# Patient Record
Sex: Female | Born: 1975 | Race: Black or African American | Hispanic: No | Marital: Single | Smoking: Never smoker
Health system: Southern US, Community
[De-identification: ages and names within clinical notes are randomized; demographics above are authoritative.]

## PROBLEM LIST (undated history)

## (undated) DIAGNOSIS — F419 Anxiety disorder, unspecified: Secondary | ICD-10-CM

## (undated) DIAGNOSIS — F32A Depression, unspecified: Secondary | ICD-10-CM

## (undated) DIAGNOSIS — T7840XA Allergy, unspecified, initial encounter: Secondary | ICD-10-CM

## (undated) HISTORY — DX: Depression, unspecified: F32.A

## (undated) HISTORY — DX: Allergy, unspecified, initial encounter: T78.40XA

## (undated) HISTORY — DX: Anxiety disorder, unspecified: F41.9

---

## 1999-06-18 ENCOUNTER — Other Ambulatory Visit: Admission: RE | Admit: 1999-06-18 | Discharge: 1999-06-18 | Payer: Self-pay | Admitting: *Deleted

## 2000-07-21 ENCOUNTER — Other Ambulatory Visit: Admission: RE | Admit: 2000-07-21 | Discharge: 2000-07-21 | Payer: Self-pay | Admitting: *Deleted

## 2002-10-04 ENCOUNTER — Other Ambulatory Visit: Admission: RE | Admit: 2002-10-04 | Discharge: 2002-10-04 | Payer: Self-pay | Admitting: *Deleted

## 2003-10-05 ENCOUNTER — Other Ambulatory Visit: Admission: RE | Admit: 2003-10-05 | Discharge: 2003-10-05 | Payer: Self-pay | Admitting: *Deleted

## 2003-12-05 ENCOUNTER — Encounter: Admission: RE | Admit: 2003-12-05 | Discharge: 2003-12-05 | Payer: Self-pay | Admitting: Family Medicine

## 2004-11-06 ENCOUNTER — Other Ambulatory Visit: Admission: RE | Admit: 2004-11-06 | Discharge: 2004-11-06 | Payer: Self-pay | Admitting: *Deleted

## 2004-12-27 ENCOUNTER — Emergency Department (HOSPITAL_COMMUNITY): Admission: EM | Admit: 2004-12-27 | Discharge: 2004-12-27 | Payer: Self-pay | Admitting: Emergency Medicine

## 2005-11-26 ENCOUNTER — Other Ambulatory Visit: Admission: RE | Admit: 2005-11-26 | Discharge: 2005-11-26 | Payer: Self-pay | Admitting: *Deleted

## 2006-12-01 ENCOUNTER — Other Ambulatory Visit: Admission: RE | Admit: 2006-12-01 | Discharge: 2006-12-01 | Payer: Self-pay | Admitting: Family Medicine

## 2007-12-02 ENCOUNTER — Other Ambulatory Visit: Admission: RE | Admit: 2007-12-02 | Discharge: 2007-12-02 | Payer: Self-pay | Admitting: Family Medicine

## 2008-12-05 ENCOUNTER — Other Ambulatory Visit: Admission: RE | Admit: 2008-12-05 | Discharge: 2008-12-05 | Payer: Self-pay | Admitting: Family Medicine

## 2010-01-08 ENCOUNTER — Other Ambulatory Visit: Admission: RE | Admit: 2010-01-08 | Discharge: 2010-01-08 | Payer: Self-pay | Admitting: Obstetrics and Gynecology

## 2011-01-14 ENCOUNTER — Other Ambulatory Visit (HOSPITAL_COMMUNITY)
Admission: RE | Admit: 2011-01-14 | Discharge: 2011-01-14 | Disposition: A | Discharge status: Home / Self Care | Payer: 59 | Source: Ambulatory Visit | Attending: Obstetrics and Gynecology | Admitting: Obstetrics and Gynecology

## 2011-01-14 ENCOUNTER — Other Ambulatory Visit: Payer: Self-pay | Admitting: Obstetrics and Gynecology

## 2011-01-14 DIAGNOSIS — Z01419 Encounter for gynecological examination (general) (routine) without abnormal findings: Secondary | ICD-10-CM | POA: Insufficient documentation

## 2011-01-14 DIAGNOSIS — Z113 Encounter for screening for infections with a predominantly sexual mode of transmission: Secondary | ICD-10-CM | POA: Insufficient documentation

## 2011-02-18 HISTORY — PX: REDUCTION MAMMAPLASTY: SUR839

## 2011-07-07 ENCOUNTER — Ambulatory Visit: Payer: 59

## 2011-07-07 ENCOUNTER — Ambulatory Visit: Payer: 59 | Admitting: Internal Medicine

## 2011-07-07 VITALS — BP 134/87 | HR 87 | Temp 98.4°F | Resp 16 | Ht 65.25 in | Wt 194.2 lb

## 2011-07-07 DIAGNOSIS — M25571 Pain in right ankle and joints of right foot: Secondary | ICD-10-CM

## 2011-07-07 DIAGNOSIS — S93409A Sprain of unspecified ligament of unspecified ankle, initial encounter: Secondary | ICD-10-CM

## 2011-07-07 DIAGNOSIS — M25579 Pain in unspecified ankle and joints of unspecified foot: Secondary | ICD-10-CM

## 2011-07-07 DIAGNOSIS — S99911A Unspecified injury of right ankle, initial encounter: Secondary | ICD-10-CM

## 2011-07-07 NOTE — Progress Notes (Signed)
  Subjective:    Patient ID: Brittany Mcclain, female    DOB: August 17, 1975, 36 y.o.   MRN: [id number]  HPI Pain in the right ankle. She twisted her ankle 1 week ago while roller skating. has been ambulatory since but pain increases with motion and ambulation. pain 5/10 increased in the lateral part of hte right ankle. No other pain.No other injuryReview of Systems  Constitutional: Negative.   HENT: Negative.   Eyes: Negative.   Respiratory: Negative.   Cardiovascular: Negative.   Gastrointestinal: Negative.   Genitourinary: Negative.   Musculoskeletal: Positive for joint swelling.  Skin: Negative.   Neurological: Negative.   Hematological: Negative.   Psychiatric/Behavioral: Negative.   All other systems reviewed and are negative.       Objective:   Physical Exam  Nursing note and vitals reviewed. Constitutional: She is oriented to person, place, and time. She appears well-developed and well-nourished.  HENT:  Head: Normocephalic and atraumatic.  Right Ear: External ear normal.  Left Ear: External ear normal.  Eyes: Conjunctivae and EOM are normal. Pupils are equal, round, and reactive to light.  Neck: Normal range of motion. Neck supple.  Cardiovascular: Normal rate, regular rhythm and normal heart sounds.   Pulmonary/Chest: Effort normal and breath sounds normal.  Abdominal: Soft. Bowel sounds are normal.  Musculoskeletal: She exhibits tenderness.  Neurological: She is alert and oriented to person, place, and time. She has normal reflexes.  Skin: Skin is warm and dry.  Psychiatric: She has a normal mood and affect. Her behavior is normal. Judgment and thought content normal.     [facility] reading (PRIMARY) by  Dr. Mindi Junker  Right ankle xray is negitive.      Assessment & Plan:  Pain and swelling of the right ankle will do xray and treat accordingly

## 2011-07-07 NOTE — Patient Instructions (Signed)
Stirrup splint for 3 weeks. Ice elevation wear a supportive shoe, antiinflammatories for pain

## 2011-10-02 ENCOUNTER — Other Ambulatory Visit: Payer: Self-pay | Admitting: Plastic Surgery

## 2012-02-16 ENCOUNTER — Other Ambulatory Visit (HOSPITAL_COMMUNITY)
Admission: RE | Admit: 2012-02-16 | Discharge: 2012-02-16 | Disposition: A | Discharge status: Home / Self Care | Payer: 59 | Source: Ambulatory Visit | Attending: Obstetrics and Gynecology | Admitting: Obstetrics and Gynecology

## 2012-02-16 ENCOUNTER — Other Ambulatory Visit: Payer: Self-pay | Admitting: Obstetrics and Gynecology

## 2012-02-16 DIAGNOSIS — Z113 Encounter for screening for infections with a predominantly sexual mode of transmission: Secondary | ICD-10-CM | POA: Insufficient documentation

## 2012-02-16 DIAGNOSIS — Z01419 Encounter for gynecological examination (general) (routine) without abnormal findings: Secondary | ICD-10-CM | POA: Insufficient documentation

## 2013-04-18 ENCOUNTER — Other Ambulatory Visit (HOSPITAL_COMMUNITY)
Admission: RE | Admit: 2013-04-18 | Discharge: 2013-04-18 | Disposition: A | Discharge status: Home / Self Care | Payer: 59 | Source: Ambulatory Visit | Attending: Obstetrics and Gynecology | Admitting: Obstetrics and Gynecology

## 2013-04-18 ENCOUNTER — Other Ambulatory Visit: Payer: Self-pay | Admitting: Obstetrics and Gynecology

## 2013-04-18 DIAGNOSIS — R87810 Cervical high risk human papillomavirus (HPV) DNA test positive: Secondary | ICD-10-CM | POA: Insufficient documentation

## 2013-04-18 DIAGNOSIS — Z1151 Encounter for screening for human papillomavirus (HPV): Secondary | ICD-10-CM | POA: Insufficient documentation

## 2013-04-18 DIAGNOSIS — Z124 Encounter for screening for malignant neoplasm of cervix: Secondary | ICD-10-CM | POA: Insufficient documentation

## 2013-08-01 ENCOUNTER — Other Ambulatory Visit: Payer: Self-pay

## 2013-08-01 DIAGNOSIS — Z1231 Encounter for screening mammogram for malignant neoplasm of breast: Secondary | ICD-10-CM

## 2013-08-08 ENCOUNTER — Encounter (INDEPENDENT_AMBULATORY_CARE_PROVIDER_SITE_OTHER): Payer: Self-pay

## 2013-08-08 ENCOUNTER — Ambulatory Visit
Admission: RE | Admit: 2013-08-08 | Discharge: 2013-08-08 | Disposition: A | Discharge status: Home / Self Care | Payer: 59 | Source: Ambulatory Visit

## 2013-08-08 DIAGNOSIS — Z1231 Encounter for screening mammogram for malignant neoplasm of breast: Secondary | ICD-10-CM

## 2013-11-23 ENCOUNTER — Other Ambulatory Visit (HOSPITAL_COMMUNITY)
Admission: RE | Admit: 2013-11-23 | Discharge: 2013-11-23 | Disposition: A | Discharge status: Home / Self Care | Payer: 59 | Source: Ambulatory Visit | Attending: Obstetrics and Gynecology | Admitting: Obstetrics and Gynecology

## 2013-11-23 ENCOUNTER — Other Ambulatory Visit: Payer: Self-pay | Admitting: Obstetrics and Gynecology

## 2013-11-23 DIAGNOSIS — Z01419 Encounter for gynecological examination (general) (routine) without abnormal findings: Secondary | ICD-10-CM | POA: Diagnosis present

## 2013-11-25 LAB — CYTOLOGY - PAP

## 2014-04-18 ENCOUNTER — Other Ambulatory Visit: Payer: Self-pay | Admitting: Obstetrics and Gynecology

## 2014-04-18 ENCOUNTER — Other Ambulatory Visit (HOSPITAL_COMMUNITY)
Admission: RE | Admit: 2014-04-18 | Discharge: 2014-04-18 | Disposition: A | Discharge status: Home / Self Care | Payer: 59 | Source: Ambulatory Visit | Attending: Obstetrics and Gynecology | Admitting: Obstetrics and Gynecology

## 2014-04-18 DIAGNOSIS — Z01419 Encounter for gynecological examination (general) (routine) without abnormal findings: Secondary | ICD-10-CM | POA: Diagnosis not present

## 2014-04-20 LAB — CYTOLOGY - PAP

## 2014-07-24 ENCOUNTER — Other Ambulatory Visit: Payer: Self-pay

## 2014-07-24 DIAGNOSIS — Z1231 Encounter for screening mammogram for malignant neoplasm of breast: Secondary | ICD-10-CM

## 2014-08-23 ENCOUNTER — Ambulatory Visit: Payer: 59

## 2014-08-25 ENCOUNTER — Ambulatory Visit
Admission: RE | Admit: 2014-08-25 | Discharge: 2014-08-25 | Disposition: A | Discharge status: Home / Self Care | Payer: 59 | Source: Ambulatory Visit

## 2014-08-25 DIAGNOSIS — Z1231 Encounter for screening mammogram for malignant neoplasm of breast: Secondary | ICD-10-CM

## 2014-08-28 ENCOUNTER — Other Ambulatory Visit: Payer: Self-pay | Admitting: Obstetrics and Gynecology

## 2014-08-28 DIAGNOSIS — R928 Other abnormal and inconclusive findings on diagnostic imaging of breast: Secondary | ICD-10-CM

## 2014-09-01 ENCOUNTER — Ambulatory Visit
Admission: RE | Admit: 2014-09-01 | Discharge: 2014-09-01 | Disposition: A | Discharge status: Home / Self Care | Payer: 59 | Source: Ambulatory Visit | Attending: Obstetrics and Gynecology | Admitting: Obstetrics and Gynecology

## 2014-09-01 DIAGNOSIS — R928 Other abnormal and inconclusive findings on diagnostic imaging of breast: Secondary | ICD-10-CM

## 2015-05-02 ENCOUNTER — Other Ambulatory Visit: Payer: Self-pay | Admitting: Obstetrics and Gynecology

## 2015-05-02 ENCOUNTER — Other Ambulatory Visit (HOSPITAL_COMMUNITY)
Admission: RE | Admit: 2015-05-02 | Discharge: 2015-05-02 | Disposition: A | Discharge status: Home / Self Care | Payer: 59 | Source: Ambulatory Visit | Attending: Obstetrics and Gynecology | Admitting: Obstetrics and Gynecology

## 2015-05-02 DIAGNOSIS — Z01419 Encounter for gynecological examination (general) (routine) without abnormal findings: Secondary | ICD-10-CM | POA: Diagnosis present

## 2015-05-04 LAB — CYTOLOGY - PAP

## 2015-08-31 ENCOUNTER — Other Ambulatory Visit: Payer: Self-pay | Admitting: Obstetrics and Gynecology

## 2015-08-31 DIAGNOSIS — Z1231 Encounter for screening mammogram for malignant neoplasm of breast: Secondary | ICD-10-CM

## 2015-08-31 DIAGNOSIS — Z9889 Other specified postprocedural states: Secondary | ICD-10-CM

## 2015-09-04 ENCOUNTER — Ambulatory Visit
Admission: RE | Admit: 2015-09-04 | Discharge: 2015-09-04 | Disposition: A | Discharge status: Home / Self Care | Payer: 59 | Source: Ambulatory Visit | Attending: Obstetrics and Gynecology | Admitting: Obstetrics and Gynecology

## 2015-09-04 DIAGNOSIS — Z9889 Other specified postprocedural states: Secondary | ICD-10-CM

## 2015-09-04 DIAGNOSIS — Z1231 Encounter for screening mammogram for malignant neoplasm of breast: Secondary | ICD-10-CM

## 2016-05-05 ENCOUNTER — Other Ambulatory Visit (HOSPITAL_COMMUNITY)
Admission: RE | Admit: 2016-05-05 | Discharge: 2016-05-05 | Disposition: A | Discharge status: Home / Self Care | Payer: 59 | Source: Ambulatory Visit | Attending: Obstetrics and Gynecology | Admitting: Obstetrics and Gynecology

## 2016-05-05 ENCOUNTER — Other Ambulatory Visit: Payer: Self-pay | Admitting: Obstetrics and Gynecology

## 2016-05-05 DIAGNOSIS — Z01419 Encounter for gynecological examination (general) (routine) without abnormal findings: Secondary | ICD-10-CM | POA: Diagnosis not present

## 2016-05-05 DIAGNOSIS — Z1151 Encounter for screening for human papillomavirus (HPV): Secondary | ICD-10-CM | POA: Diagnosis not present

## 2016-05-07 LAB — CYTOLOGY - PAP
DIAGNOSIS: NEGATIVE
HPV: NOT DETECTED

## 2016-08-21 ENCOUNTER — Other Ambulatory Visit: Payer: Self-pay | Admitting: Obstetrics and Gynecology

## 2016-08-21 DIAGNOSIS — Z1231 Encounter for screening mammogram for malignant neoplasm of breast: Secondary | ICD-10-CM

## 2016-09-05 ENCOUNTER — Ambulatory Visit
Admission: RE | Admit: 2016-09-05 | Discharge: 2016-09-05 | Disposition: A | Discharge status: Home / Self Care | Payer: 59 | Source: Ambulatory Visit | Attending: Obstetrics and Gynecology | Admitting: Obstetrics and Gynecology

## 2016-09-05 DIAGNOSIS — Z1231 Encounter for screening mammogram for malignant neoplasm of breast: Secondary | ICD-10-CM

## 2017-02-02 DIAGNOSIS — H40013 Open angle with borderline findings, low risk, bilateral: Secondary | ICD-10-CM | POA: Diagnosis not present

## 2017-05-07 DIAGNOSIS — Z01419 Encounter for gynecological examination (general) (routine) without abnormal findings: Secondary | ICD-10-CM | POA: Diagnosis not present

## 2017-08-06 DIAGNOSIS — Z83511 Family history of glaucoma: Secondary | ICD-10-CM | POA: Diagnosis not present

## 2017-08-06 DIAGNOSIS — H40013 Open angle with borderline findings, low risk, bilateral: Secondary | ICD-10-CM | POA: Diagnosis not present

## 2017-08-21 ENCOUNTER — Other Ambulatory Visit: Payer: Self-pay | Admitting: Obstetrics and Gynecology

## 2017-08-21 DIAGNOSIS — Z0000 Encounter for general adult medical examination without abnormal findings: Secondary | ICD-10-CM | POA: Diagnosis not present

## 2017-08-21 DIAGNOSIS — Z1231 Encounter for screening mammogram for malignant neoplasm of breast: Secondary | ICD-10-CM

## 2017-08-21 DIAGNOSIS — E7800 Pure hypercholesterolemia, unspecified: Secondary | ICD-10-CM | POA: Diagnosis not present

## 2017-09-14 ENCOUNTER — Ambulatory Visit
Admission: RE | Admit: 2017-09-14 | Discharge: 2017-09-14 | Disposition: A | Discharge status: Home / Self Care | Payer: 59 | Source: Ambulatory Visit | Attending: Obstetrics and Gynecology | Admitting: Obstetrics and Gynecology

## 2017-09-14 DIAGNOSIS — Z1231 Encounter for screening mammogram for malignant neoplasm of breast: Secondary | ICD-10-CM | POA: Diagnosis not present

## 2017-11-09 DIAGNOSIS — Z23 Encounter for immunization: Secondary | ICD-10-CM | POA: Diagnosis not present

## 2018-01-29 DIAGNOSIS — H40013 Open angle with borderline findings, low risk, bilateral: Secondary | ICD-10-CM | POA: Diagnosis not present

## 2018-03-10 DIAGNOSIS — E7800 Pure hypercholesterolemia, unspecified: Secondary | ICD-10-CM | POA: Diagnosis not present

## 2018-05-11 DIAGNOSIS — Z01419 Encounter for gynecological examination (general) (routine) without abnormal findings: Secondary | ICD-10-CM | POA: Diagnosis not present

## 2018-06-28 DIAGNOSIS — E7800 Pure hypercholesterolemia, unspecified: Secondary | ICD-10-CM | POA: Diagnosis not present

## 2018-08-17 DIAGNOSIS — H40013 Open angle with borderline findings, low risk, bilateral: Secondary | ICD-10-CM | POA: Diagnosis not present

## 2018-08-30 DIAGNOSIS — E7800 Pure hypercholesterolemia, unspecified: Secondary | ICD-10-CM | POA: Diagnosis not present

## 2018-08-30 DIAGNOSIS — J309 Allergic rhinitis, unspecified: Secondary | ICD-10-CM | POA: Diagnosis not present

## 2018-08-30 DIAGNOSIS — Z0000 Encounter for general adult medical examination without abnormal findings: Secondary | ICD-10-CM | POA: Diagnosis not present

## 2018-08-30 DIAGNOSIS — R51 Headache: Secondary | ICD-10-CM | POA: Diagnosis not present

## 2018-09-06 ENCOUNTER — Other Ambulatory Visit: Payer: Self-pay | Admitting: Obstetrics and Gynecology

## 2018-09-06 DIAGNOSIS — Z1231 Encounter for screening mammogram for malignant neoplasm of breast: Secondary | ICD-10-CM

## 2018-10-19 ENCOUNTER — Ambulatory Visit
Admission: RE | Admit: 2018-10-19 | Discharge: 2018-10-19 | Disposition: A | Discharge status: Home / Self Care | Payer: BC Managed Care – PPO | Source: Ambulatory Visit | Attending: Obstetrics and Gynecology | Admitting: Obstetrics and Gynecology

## 2018-10-19 ENCOUNTER — Other Ambulatory Visit: Payer: Self-pay

## 2018-10-19 DIAGNOSIS — Z1231 Encounter for screening mammogram for malignant neoplasm of breast: Secondary | ICD-10-CM

## 2018-12-31 DIAGNOSIS — E7800 Pure hypercholesterolemia, unspecified: Secondary | ICD-10-CM | POA: Diagnosis not present

## 2018-12-31 DIAGNOSIS — Z833 Family history of diabetes mellitus: Secondary | ICD-10-CM | POA: Diagnosis not present

## 2019-04-05 DIAGNOSIS — H40013 Open angle with borderline findings, low risk, bilateral: Secondary | ICD-10-CM | POA: Diagnosis not present

## 2019-05-05 ENCOUNTER — Ambulatory Visit: Payer: Self-pay | Attending: Internal Medicine

## 2019-05-05 DIAGNOSIS — Z23 Encounter for immunization: Secondary | ICD-10-CM

## 2019-05-05 NOTE — Progress Notes (Signed)
   Covid-19 Vaccination Clinic  Name:  Brittany Mcclain    MRN: [id number] DOB: [birth year]  05/05/2019  [name] was observed post Covid-19 immunization for 15 minutes without incident. She was provided with Vaccine Information Sheet and instruction to access the V-Safe system.   [name] was instructed to call 911 with any severe reactions post vaccine: Marland Kitchen Difficulty breathing  . Swelling of face and throat  . A fast heartbeat  . A bad rash all over body  . Dizziness and weakness   Immunizations Administered    Name Date Dose VIS Date Route   Pfizer COVID-19 Vaccine 05/05/2019 11:23 AM 0.3 mL 01/28/2019 Intramuscular   Manufacturer: [employer], [employer]   Lot: [id number]   NDC: [address]-2

## 2019-05-31 ENCOUNTER — Ambulatory Visit: Payer: Self-pay | Attending: Internal Medicine

## 2019-05-31 DIAGNOSIS — Z23 Encounter for immunization: Secondary | ICD-10-CM

## 2019-05-31 NOTE — Progress Notes (Signed)
   Covid-19 Vaccination Clinic  Name:  [name]    MRN: [id number] DOB: 1975/07/22  05/31/2019  [name] was observed post Covid-19 immunization for 15 minutes without incident. She was provided with Vaccine Information Sheet and instruction to access the V-Safe system.   [name] was instructed to call 911 with any severe reactions post vaccine: Marland Kitchen Difficulty breathing  . Swelling of face and throat  . A fast heartbeat  . A bad rash all over body  . Dizziness and weakness   Immunizations Administered    Name Date Dose VIS Date Route   Pfizer COVID-19 Vaccine 05/31/2019  9:44 AM 0.3 mL 01/28/2019 Intramuscular   Manufacturer: [employer], [employer]   Lot: [id number]   NDC: [address]-3

## 2019-07-12 DIAGNOSIS — Z01419 Encounter for gynecological examination (general) (routine) without abnormal findings: Secondary | ICD-10-CM | POA: Diagnosis not present

## 2019-09-13 DIAGNOSIS — Z1211 Encounter for screening for malignant neoplasm of colon: Secondary | ICD-10-CM | POA: Diagnosis not present

## 2019-10-18 ENCOUNTER — Other Ambulatory Visit: Payer: Self-pay | Admitting: Obstetrics and Gynecology

## 2019-10-18 DIAGNOSIS — Z1231 Encounter for screening mammogram for malignant neoplasm of breast: Secondary | ICD-10-CM

## 2019-10-18 DIAGNOSIS — R519 Headache, unspecified: Secondary | ICD-10-CM | POA: Diagnosis not present

## 2019-10-18 DIAGNOSIS — J309 Allergic rhinitis, unspecified: Secondary | ICD-10-CM | POA: Diagnosis not present

## 2019-10-18 DIAGNOSIS — Z0000 Encounter for general adult medical examination without abnormal findings: Secondary | ICD-10-CM | POA: Diagnosis not present

## 2019-10-18 DIAGNOSIS — E7800 Pure hypercholesterolemia, unspecified: Secondary | ICD-10-CM | POA: Diagnosis not present

## 2019-10-18 DIAGNOSIS — R7303 Prediabetes: Secondary | ICD-10-CM | POA: Diagnosis not present

## 2019-10-25 ENCOUNTER — Other Ambulatory Visit: Payer: Self-pay

## 2019-10-25 ENCOUNTER — Ambulatory Visit
Admission: RE | Admit: 2019-10-25 | Discharge: 2019-10-25 | Disposition: A | Discharge status: Home / Self Care | Payer: Self-pay | Source: Ambulatory Visit | Attending: Obstetrics and Gynecology | Admitting: Obstetrics and Gynecology

## 2019-10-25 DIAGNOSIS — Z1231 Encounter for screening mammogram for malignant neoplasm of breast: Secondary | ICD-10-CM

## 2019-10-27 DIAGNOSIS — Z1159 Encounter for screening for other viral diseases: Secondary | ICD-10-CM | POA: Diagnosis not present

## 2019-10-31 DIAGNOSIS — K644 Residual hemorrhoidal skin tags: Secondary | ICD-10-CM | POA: Diagnosis not present

## 2019-10-31 DIAGNOSIS — Z1211 Encounter for screening for malignant neoplasm of colon: Secondary | ICD-10-CM | POA: Diagnosis not present

## 2019-10-31 DIAGNOSIS — K648 Other hemorrhoids: Secondary | ICD-10-CM | POA: Diagnosis not present

## 2019-10-31 DIAGNOSIS — K6389 Other specified diseases of intestine: Secondary | ICD-10-CM | POA: Diagnosis not present

## 2019-10-31 DIAGNOSIS — D125 Benign neoplasm of sigmoid colon: Secondary | ICD-10-CM | POA: Diagnosis not present

## 2020-01-18 DIAGNOSIS — H40013 Open angle with borderline findings, low risk, bilateral: Secondary | ICD-10-CM | POA: Diagnosis not present

## 2020-02-27 DIAGNOSIS — Z3202 Encounter for pregnancy test, result negative: Secondary | ICD-10-CM | POA: Diagnosis not present

## 2020-02-27 DIAGNOSIS — Z30433 Encounter for removal and reinsertion of intrauterine contraceptive device: Secondary | ICD-10-CM | POA: Diagnosis not present

## 2020-04-10 DIAGNOSIS — Z30431 Encounter for routine checking of intrauterine contraceptive device: Secondary | ICD-10-CM | POA: Diagnosis not present

## 2020-04-18 DIAGNOSIS — H40021 Open angle with borderline findings, high risk, right eye: Secondary | ICD-10-CM | POA: Diagnosis not present

## 2020-09-28 ENCOUNTER — Other Ambulatory Visit: Payer: Self-pay | Admitting: Obstetrics and Gynecology

## 2020-09-28 DIAGNOSIS — Z1231 Encounter for screening mammogram for malignant neoplasm of breast: Secondary | ICD-10-CM

## 2020-10-18 DIAGNOSIS — R519 Headache, unspecified: Secondary | ICD-10-CM | POA: Diagnosis not present

## 2020-10-18 DIAGNOSIS — E7800 Pure hypercholesterolemia, unspecified: Secondary | ICD-10-CM | POA: Diagnosis not present

## 2020-10-18 DIAGNOSIS — R7303 Prediabetes: Secondary | ICD-10-CM | POA: Diagnosis not present

## 2020-10-18 DIAGNOSIS — Z0000 Encounter for general adult medical examination without abnormal findings: Secondary | ICD-10-CM | POA: Diagnosis not present

## 2020-10-25 ENCOUNTER — Ambulatory Visit
Admission: RE | Admit: 2020-10-25 | Discharge: 2020-10-25 | Disposition: A | Discharge status: Home / Self Care | Payer: BC Managed Care – PPO | Source: Ambulatory Visit | Attending: Obstetrics and Gynecology | Admitting: Obstetrics and Gynecology

## 2020-10-25 ENCOUNTER — Other Ambulatory Visit: Payer: Self-pay

## 2020-10-25 DIAGNOSIS — Z1231 Encounter for screening mammogram for malignant neoplasm of breast: Secondary | ICD-10-CM

## 2020-10-25 DIAGNOSIS — H40023 Open angle with borderline findings, high risk, bilateral: Secondary | ICD-10-CM | POA: Diagnosis not present

## 2021-01-24 DIAGNOSIS — H40023 Open angle with borderline findings, high risk, bilateral: Secondary | ICD-10-CM | POA: Diagnosis not present

## 2021-08-02 DIAGNOSIS — H40023 Open angle with borderline findings, high risk, bilateral: Secondary | ICD-10-CM | POA: Diagnosis not present

## 2021-09-17 ENCOUNTER — Other Ambulatory Visit: Payer: Self-pay | Admitting: Obstetrics and Gynecology

## 2021-09-17 DIAGNOSIS — Z1231 Encounter for screening mammogram for malignant neoplasm of breast: Secondary | ICD-10-CM

## 2021-10-25 DIAGNOSIS — J309 Allergic rhinitis, unspecified: Secondary | ICD-10-CM | POA: Diagnosis not present

## 2021-10-25 DIAGNOSIS — R519 Headache, unspecified: Secondary | ICD-10-CM | POA: Diagnosis not present

## 2021-10-25 DIAGNOSIS — E7800 Pure hypercholesterolemia, unspecified: Secondary | ICD-10-CM | POA: Diagnosis not present

## 2021-10-25 DIAGNOSIS — Z0000 Encounter for general adult medical examination without abnormal findings: Secondary | ICD-10-CM | POA: Diagnosis not present

## 2021-10-25 DIAGNOSIS — R7303 Prediabetes: Secondary | ICD-10-CM | POA: Diagnosis not present

## 2021-10-29 ENCOUNTER — Ambulatory Visit
Admission: RE | Admit: 2021-10-29 | Discharge: 2021-10-29 | Disposition: A | Discharge status: Home / Self Care | Payer: BC Managed Care – PPO | Source: Ambulatory Visit | Attending: Obstetrics and Gynecology | Admitting: Obstetrics and Gynecology

## 2021-10-29 DIAGNOSIS — Z1231 Encounter for screening mammogram for malignant neoplasm of breast: Secondary | ICD-10-CM | POA: Diagnosis not present

## 2021-11-06 IMAGING — MG MM DIGITAL SCREENING BILAT W/ TOMO AND CAD
8 series · 8 of 24 positions shown · non-contrast
Comparison: Previous exam(s).

ACR Breast Density Category a: The breast tissue is almost entirely
fatty.

CLINICAL DATA: Screening.

EXAM:
DIGITAL SCREENING BILATERAL MAMMOGRAM WITH TOMOSYNTHESIS AND CAD
TECHNIQUE: Bilateral screening digital craniocaudal and mediolateral oblique
mammograms were obtained. Bilateral screening digital breast
tomosynthesis was performed. The images were evaluated with
computer-aided detection.

[R MLO synth-2D]
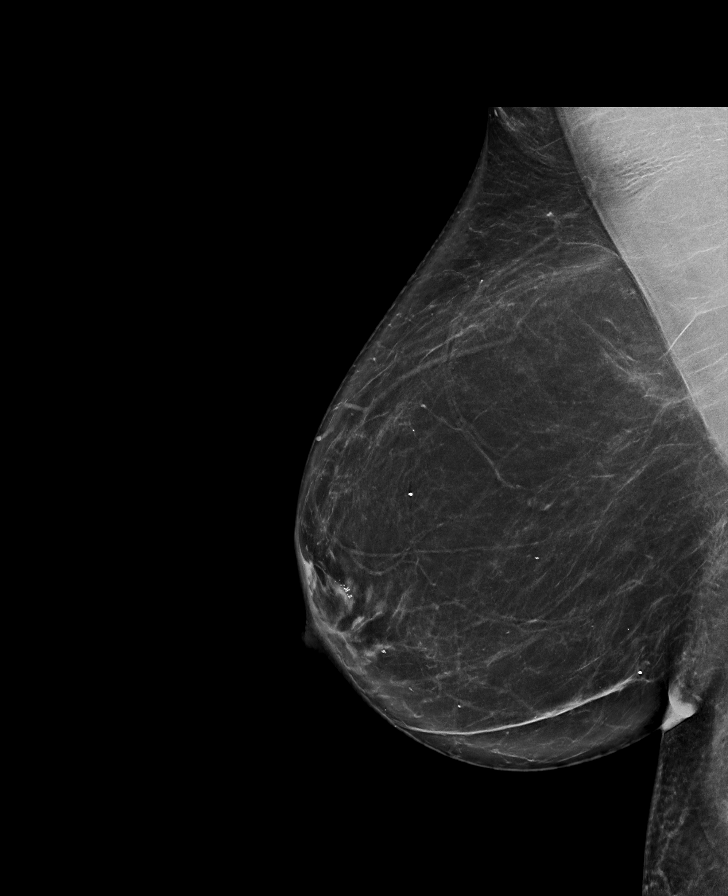

[L CC synth-2D]
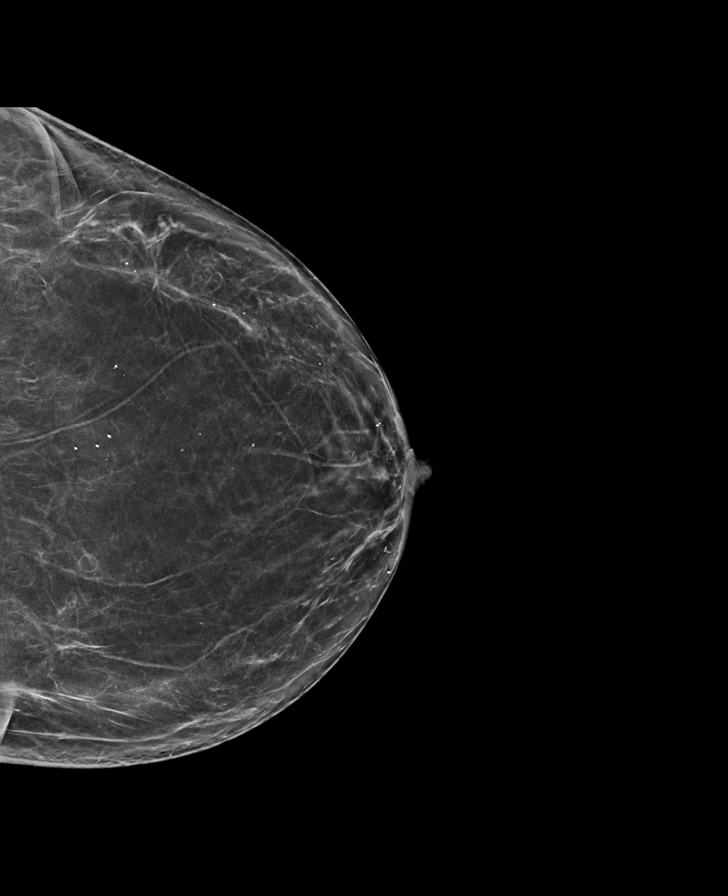

[R CC synth-2D]
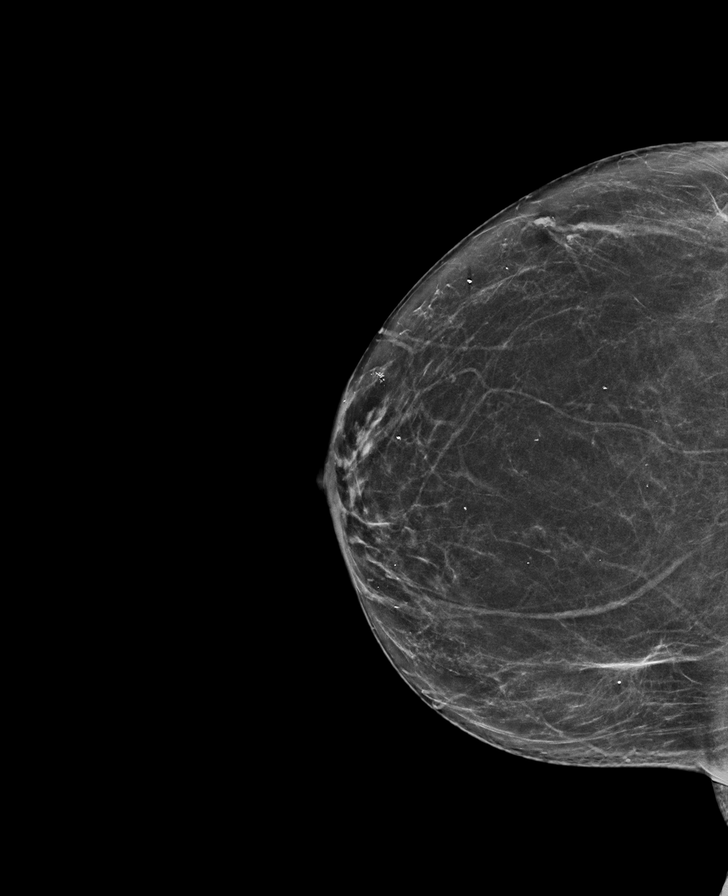

[L MLO synth-2D]
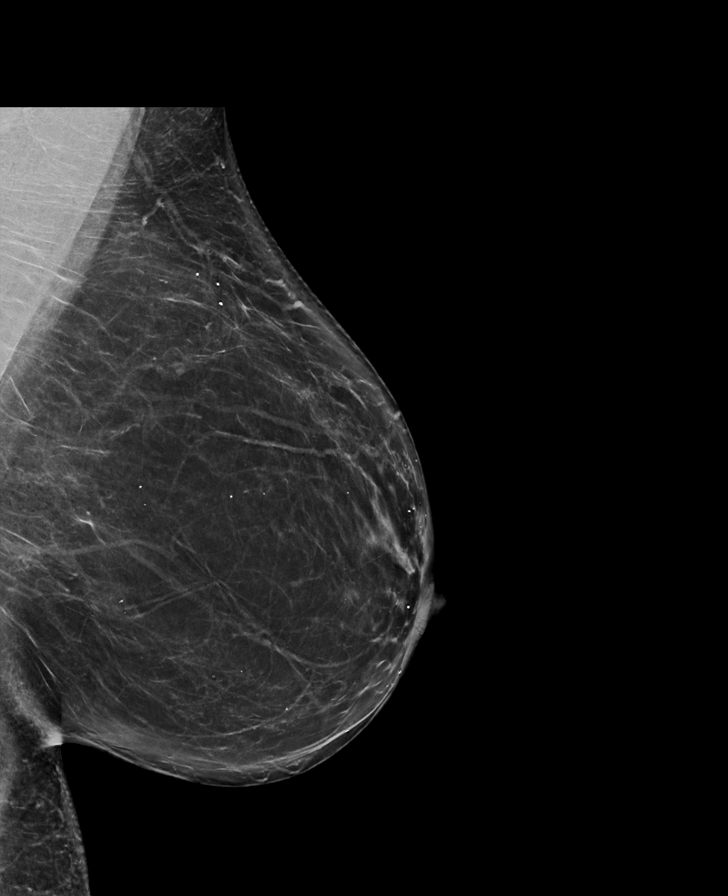

[R MLO tomo · tomo slice 43/86.0]
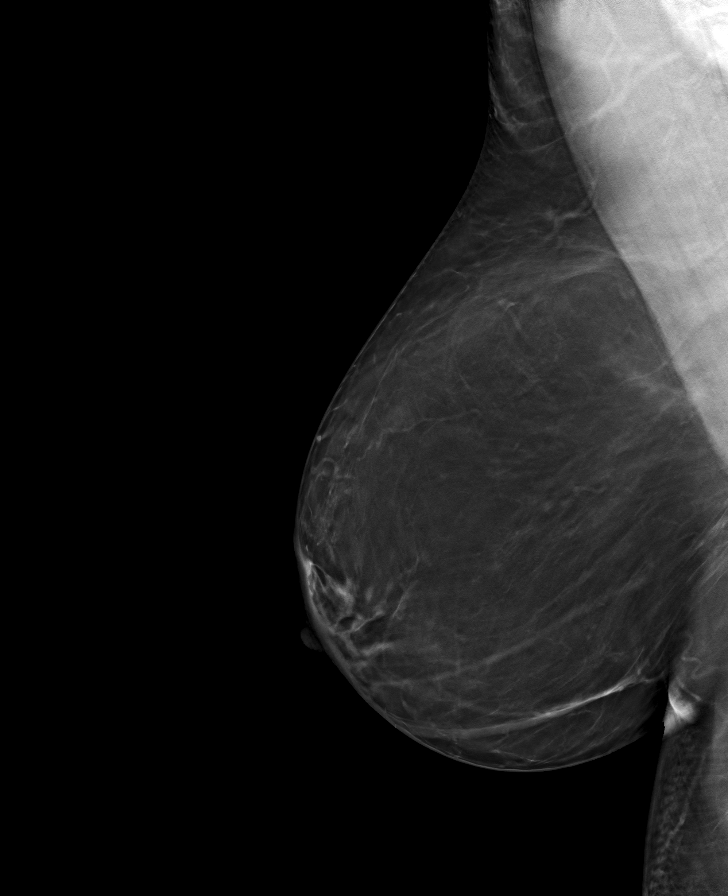

[L CC tomo · tomo slice 35/69.0]
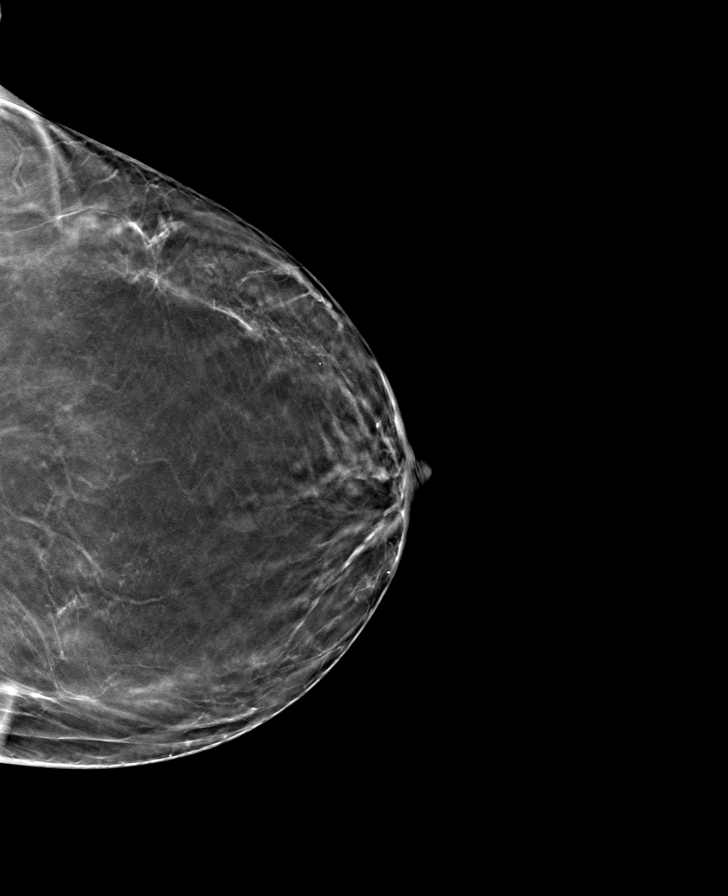

[L MLO tomo · tomo slice 41/80.0]
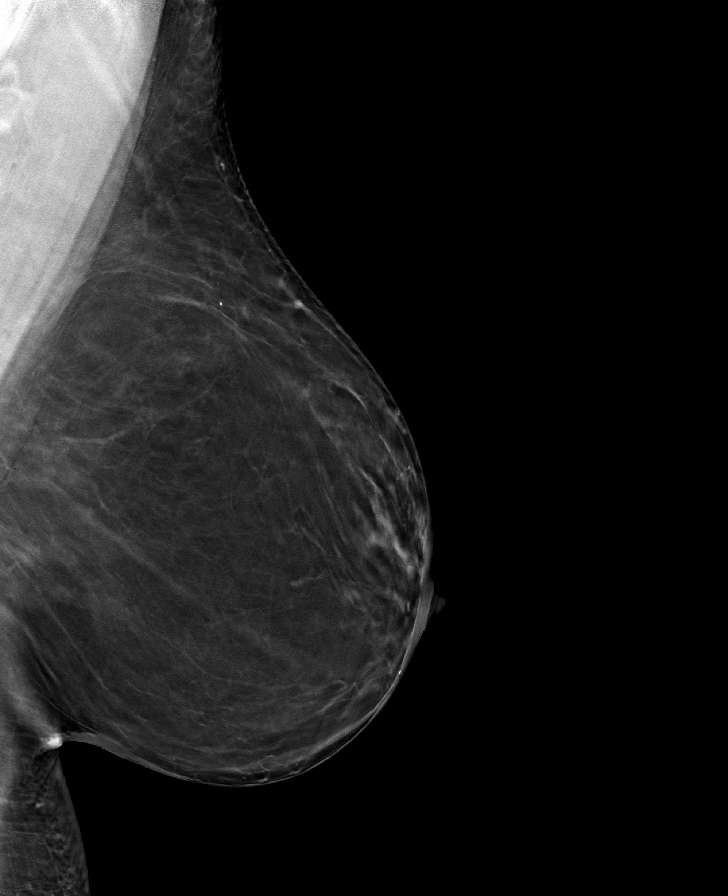

[R CC tomo · tomo slice 34/67.0]
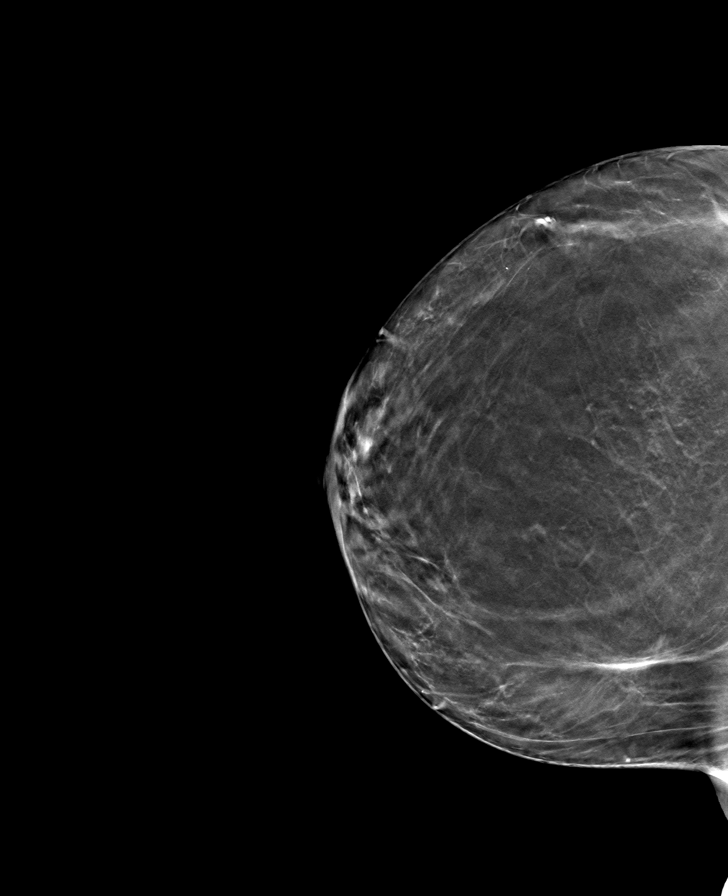

[8 of 24 positions shown; findings below may reference images not displayed]

FINDINGS: There are no findings suspicious for malignancy.
IMPRESSION: No mammographic evidence of malignancy. A result letter of this
screening mammogram will be mailed directly to the patient.

RECOMMENDATION:
Screening mammogram in one year. (Code:0E-3-N98)

BI-RADS CATEGORY  1: Negative.

## 2022-02-19 DIAGNOSIS — H40023 Open angle with borderline findings, high risk, bilateral: Secondary | ICD-10-CM | POA: Diagnosis not present

## 2022-07-01 DIAGNOSIS — H40023 Open angle with borderline findings, high risk, bilateral: Secondary | ICD-10-CM | POA: Diagnosis not present

## 2022-07-19 DIAGNOSIS — R7309 Other abnormal glucose: Secondary | ICD-10-CM | POA: Diagnosis not present

## 2022-08-18 DIAGNOSIS — R7309 Other abnormal glucose: Secondary | ICD-10-CM | POA: Diagnosis not present

## 2022-08-28 ENCOUNTER — Encounter: Payer: Self-pay | Admitting: Family Medicine

## 2022-08-28 ENCOUNTER — Ambulatory Visit: Payer: BC Managed Care – PPO | Admitting: Nurse Practitioner

## 2022-08-28 ENCOUNTER — Ambulatory Visit: Payer: BC Managed Care – PPO | Admitting: Family Medicine

## 2022-08-28 VITALS — BP 140/90 | HR 88 | Temp 99.1°F | Ht 66.8 in | Wt 197.6 lb

## 2022-08-28 DIAGNOSIS — E785 Hyperlipidemia, unspecified: Secondary | ICD-10-CM | POA: Diagnosis not present

## 2022-08-28 DIAGNOSIS — I10 Essential (primary) hypertension: Secondary | ICD-10-CM

## 2022-08-28 DIAGNOSIS — Z6831 Body mass index (BMI) 31.0-31.9, adult: Secondary | ICD-10-CM

## 2022-08-28 DIAGNOSIS — R7303 Prediabetes: Secondary | ICD-10-CM | POA: Diagnosis not present

## 2022-08-28 DIAGNOSIS — R030 Elevated blood-pressure reading, without diagnosis of hypertension: Secondary | ICD-10-CM | POA: Diagnosis not present

## 2022-08-28 DIAGNOSIS — R7309 Other abnormal glucose: Secondary | ICD-10-CM | POA: Insufficient documentation

## 2022-08-28 DIAGNOSIS — Z7689 Persons encountering health services in other specified circumstances: Secondary | ICD-10-CM

## 2022-08-28 DIAGNOSIS — Z789 Other specified health status: Secondary | ICD-10-CM | POA: Insufficient documentation

## 2022-08-28 DIAGNOSIS — E6609 Other obesity due to excess calories: Secondary | ICD-10-CM

## 2022-08-28 DIAGNOSIS — F411 Generalized anxiety disorder: Secondary | ICD-10-CM

## 2022-08-28 DIAGNOSIS — G4700 Insomnia, unspecified: Secondary | ICD-10-CM

## 2022-08-28 LAB — CBC
Hematocrit: 38.8 % (ref 34.0–46.6)
Hemoglobin: 12.9 g/dL (ref 11.1–15.9)
MCH: 29.1 pg (ref 26.6–33.0)
MCHC: 33.2 g/dL (ref 31.5–35.7)
MCV: 87 fL (ref 79–97)
Platelets: 190 10*3/uL (ref 150–450)
RBC: 4.44 x10E6/uL (ref 3.77–5.28)
RDW: 13.9 % (ref 11.7–15.4)
WBC: 5.6 10*3/uL (ref 3.4–10.8)

## 2022-08-28 LAB — POCT URINALYSIS DIPSTICK
Bilirubin, UA: NEGATIVE
Blood, UA: NEGATIVE
Glucose, UA: NEGATIVE
Ketones, UA: NEGATIVE
Leukocytes, UA: NEGATIVE
Nitrite, UA: NEGATIVE
Protein, UA: NEGATIVE
Spec Grav, UA: 1.015 (ref 1.010–1.025)
Urobilinogen, UA: 0.2 E.U./dL
pH, UA: 6 (ref 5.0–8.0)

## 2022-08-28 LAB — BMP8+EGFR
BUN/Creatinine Ratio: 6 — ABNORMAL LOW (ref 9–23)
BUN: 6 mg/dL (ref 6–24)
CO2: 21 mmol/L (ref 20–29)
Calcium: 9.6 mg/dL (ref 8.7–10.2)
Chloride: 99 mmol/L (ref 96–106)
Creatinine, Ser: 0.94 mg/dL (ref 0.57–1.00)
Glucose: 82 mg/dL (ref 70–99)
Potassium: 3.9 mmol/L (ref 3.5–5.2)
Sodium: 136 mmol/L (ref 134–144)
eGFR: 74 mL/min/{1.73_m2} (ref >59)

## 2022-08-28 LAB — HEMOGLOBIN A1C
Est. average glucose Bld gHb Est-mCnc: 126 mg/dL
Hgb A1c MFr Bld: 6 % — ABNORMAL HIGH (ref 4.8–5.6)

## 2022-08-28 MED ORDER — AMLODIPINE BESYLATE 5 MG PO TABS: 5.0000 mg | ORAL_TABLET | Freq: Every day | ORAL | 5 refills | Status: DC

## 2022-08-28 NOTE — Progress Notes (Signed)
I,Jameka J Llittleton, CMA,acting as a Neurosurgeon for [employer], NP.,have documented all relevant documentation on the behalf of Marvie Brevik, NP,as directed by  Ingra Rother Moshe Salisbury, NP while in the presence of Jaleel Allen, NP.  Subjective:  Patient ID: Brittany Mcclain , female    DOB: 18-Nov-1975 , 47 y.o.   MRN: [id number]  Chief Complaint  Patient presents with   Establish Care   Hyperlipidemia   Insomnia   Prediabetes    HPI  Patient presents today to establish care. Patient would like to be seen for her prediabetes and also have her cholesterol check. Patient reports compliance with her cholesterol medication. She does report having difficulty sleeping and was prescribed trazodone but she admits to not taking it. She stated she just takes melatonin which has not been working well.Patient states she has been having elevated Blood pressure for months without a diagnosis of Hypertension, she is concerned and wants to be evaluated.     Past Medical History:  Diagnosis Date   Allergy    Seasonal   Anxiety December 2023   Depression December 2023     Family History  Problem Relation Age of Onset   Hypertension Mother    Hypertension Father      Current Outpatient Medications:    amLODipine (NORVASC) 5 MG tablet, Take 1 tablet (5 mg total) by mouth daily., Disp: 30 tablet, Rfl: 5   ibuprofen (ADVIL) 800 MG tablet, Take 800 mg by mouth daily as needed., Disp: , Rfl:    levonorgestrel (MIRENA) 20 MCG/DAY IUD, 1 each by Intrauterine route once., Disp: , Rfl:    Zinc 50 MG TABS, Take 1 tablet by mouth daily at 6 (six) AM., Disp: , Rfl:    atorvastatin (LIPITOR) 10 MG tablet, Take 10 mg by mouth daily., Disp: , Rfl:    Calcium Citrate 150 MG CAPS, Take 1 capsule by mouth daily at 6 (six) AM., Disp: , Rfl:    Cholecalciferol (VITAMIN D) 125 MCG (5000 UT) CAPS, Take 1 capsule by mouth daily at 6 (six) AM., Disp: , Rfl:    Coenzyme Q10-Fish Oil-Vit E (CO-Q 10 OMEGA-3 FISH OIL PO), Take 1 tablet by  mouth daily at 6 (six) AM., Disp: , Rfl:    Allergies  Allergen Reactions   Sumatriptan Other (See Comments) and Palpitations    tachycardia     Review of Systems  Constitutional: Negative.   Eyes: Negative.   Respiratory: Negative.    Cardiovascular: Negative.   Musculoskeletal: Negative.   Skin: Negative.   Neurological: Negative.  Negative for light-headedness and headaches.  Psychiatric/Behavioral:  Positive for sleep disturbance.      Today's Vitals   08/28/22 0941  BP: (!) 140/90  Pulse: 88  Temp: 99.1 F (37.3 C)  SpO2: 98%  Weight: 197 lb 9.6 oz (89.6 kg)  Height: 5' 6.8" (1.697 m)  PainSc: 0-No pain   Body mass index is 31.13 kg/m.  Wt Readings from Last 3 Encounters:  08/28/22 197 lb 9.6 oz (89.6 kg)  07/07/11 194 lb 3.2 oz (88.1 kg)     Objective:  Physical Exam Cardiovascular:     Rate and Rhythm: Normal rate and regular rhythm.  Pulmonary:     Effort: Pulmonary effort is normal.     Breath sounds: Normal breath sounds.  Abdominal:     General: Bowel sounds are normal.  Skin:    General: Skin is warm and dry.  Neurological:     Mental Status: She  is alert and oriented to person, place, and time.  Psychiatric:        Mood and Affect: Mood normal.        Behavior: Behavior normal.         Assessment And Plan:  Primary hypertension Assessment & Plan: Maintain a low salt diet, exercise and eat healthy. Take BP meds as prescribed  Orders: -     POCT urinalysis dipstick -     EKG 12-Lead -     BMP8+eGFR -     CBC -     amLODIPine Besylate; Take 1 tablet (5 mg total) by mouth daily.  Dispense: 30 tablet; Refill: 5  Prediabetes Assessment & Plan: Check A1c  Orders: -     Hemoglobin A1c  Hyperlipidemia, unspecified hyperlipidemia type Assessment & Plan: Continue atorvastatin 10mg  QD   Class 1 obesity due to excess calories with body mass index (BMI) of 31.0 to 31.9 in adult, unspecified whether serious comorbidity  present     Return in about 3 months (around 11/28/2022) for physical, 2 weeks nurse visit.  Patient was given opportunity to ask questions. Patient verbalized understanding of the plan and was able to repeat key elements of the plan. All questions were answered to their satisfaction.  Adalis Gatti Moshe Salisbury, NP  I, Murphy Bundick Moshe Salisbury, NP, have reviewed all documentation for this visit. The documentation on 09/10/22 for the exam, diagnosis, procedures, and orders are all accurate and complete.   IF YOU HAVE BEEN REFERRED TO A SPECIALIST, IT MAY TAKE 1-2 WEEKS TO SCHEDULE/PROCESS THE REFERRAL. IF YOU HAVE NOT HEARD FROM US/SPECIALIST IN TWO WEEKS, PLEASE GIVE [place] A CALL AT [phone].   THE PATIENT IS ENCOURAGED TO PRACTICE SOCIAL DISTANCING DUE TO THE COVID-19 PANDEMIC.

## 2022-08-28 NOTE — Patient Instructions (Signed)
Fat and Cholesterol Restricted Eating Plan Eating a diet that limits fat and cholesterol may help lower your risk for heart disease and other conditions. Your body needs fat and cholesterol for basic functions, but eating too much of these things can be harmful to your health. Your health care provider may order lab tests to check your blood fat (lipid) and cholesterol levels. This helps your health care provider understand your risk for certain conditions and whether you need to make diet changes. Work with your health care provider or dietitian to make an eating plan that is right for you. Your plan includes: Limit your fat intake to ______% or less of your total calories a day. This is ______g of fat per day. Limit your saturated fat intake to ______% or less of your total calories a day. This is ______g of saturated fat per day. Limit the amount of cholesterol in your diet to less than _________mg a day. Eat ___________ g of fiber a day. What are tips for following this plan? General guidelines If you are overweight, work with your health care provider to lose weight safely. Losing just 5-10% of your body weight can improve your overall health and help prevent diseases such as diabetes and heart disease. Avoid: Foods with added sugar. Fried foods. Foods that contain partially hydrogenated oils, including stick margarine, some tub margarines, cookies, crackers, and other baked goods. If you drink alcohol: Limit how much you have to: 0-1 drink a day for women who are not pregnant. 0-2 drinks a day for men. Know how much alcohol is in a drink. In the [place] one drink equals one 12 oz bottle of beer (355 mL), one 5 oz glass of wine (148 mL), or one 1 oz glass of hard liquor (44 mL). Reading food labels Check food labels for: Trans fats or partially hydrogenated oils. Avoid foods that contain these. High amounts of saturated fat. Choose foods that are low in saturated fat (less than 2 g). The  amount of cholesterol in each serving. The amount of fiber in each serving. Choose foods with healthy fats, such as: Monounsaturated and polyunsaturated fats. These include olive and canola oil, flaxseeds, walnuts, almonds, and seeds. Omega-3 fats. These are found in foods such as salmon, mackerel, sardines, tuna, flaxseed oil, and ground flaxseeds. Choose grain products that have whole grains. Look for the word "whole" as the first word in the ingredient list. Cooking Cook foods using methods other than frying. Baking, boiling, grilling, and broiling are some healthy options. Eat more home-cooked food and less restaurant, buffet, and fast food. Avoid cooking using saturated fats. Animal sources of saturated fats include meats, butter, and cream. Plant sources of saturated fats include palm oil, palm kernel oil, and coconut oil. Meal planning  At meals, imagine dividing your plate into fourths: Fill one-half of your plate with vegetables, green salads, and fruit. Fill one-fourth of your plate with whole grains. Fill one-fourth of your plate with lean protein foods. Eat fish that is high in omega-3 fats at least two times a week. Eat more foods that contain fiber, such as whole grains, beans, apples, pears, berries, broccoli, carrots, peas, and barley. These foods help promote healthy cholesterol levels in the blood. What foods should I eat? Fruits All fresh, canned (in natural juice), or frozen fruits. Vegetables Fresh or frozen vegetables (raw, steamed, roasted, or grilled). Green salads. Grains Whole grains, such as whole wheat or whole grain breads, crackers, cereals, and pasta. Unsweetened oatmeal, bulgur,  barley, quinoa, or brown rice. Corn or whole wheat flour tortillas. Meats and other proteins Ground beef (85% or leaner), grass-fed beef, or beef trimmed of fat. Skinless chicken or [place]. Ground chicken or [place]. Pork trimmed of fat. All fish and seafood. Egg whites. Dried beans,  peas, or lentils. Unsalted nuts or seeds. Unsalted canned beans. Natural nut butters without added sugar and oil. Dairy Low-fat or nonfat dairy products, such as skim or 1% milk, 2% or reduced-fat cheeses, low-fat and fat-free ricotta or cottage cheese, or plain low-fat and nonfat yogurt. Fats and oils Tub margarine without trans fats. Light or reduced-fat mayonnaise and salad dressings. Avocado. Olive, canola, sesame, or safflower oils. The items listed above may not be a complete list of foods and beverages you can eat. Contact a dietitian for more information. What foods should I avoid? Fruits Canned fruit in heavy syrup. Fruit in cream or butter sauce. Fried fruit. Vegetables Vegetables cooked in cheese, cream, or butter sauce. Fried vegetables. Grains White bread. White pasta. White rice. Cornbread. Bagels, pastries, and croissants. Crackers and snack foods that contain trans fat and hydrogenated oils. Meats and other proteins Fatty cuts of meat. Ribs, chicken wings, bacon, sausage, bologna, salami, chitterlings, fatback, hot dogs, bratwurst, and packaged lunch meats. Liver and organ meats. Whole eggs and egg yolks. Chicken and [place] with skin. Fried meat. Dairy Whole or 2% milk, cream, half-and-half, and cream cheese. Whole milk cheeses. Whole-fat or sweetened yogurt. Full-fat cheeses. Nondairy creamers and whipped toppings. Processed cheese, cheese spreads, and cheese curds. Fats and oils Butter, stick margarine, lard, shortening, ghee, or bacon fat. Coconut, palm kernel, and palm oils. Beverages Alcohol. Sugar-sweetened drinks such as sodas, lemonade, and fruit drinks. Sweets and desserts Corn syrup, sugars, honey, and molasses. Candy. Jam and jelly. Syrup. Sweetened cereals. Cookies, pies, cakes, donuts, muffins, and ice cream. The items listed above may not be a complete list of foods and beverages you should avoid. Contact a dietitian for more information. Summary Your body needs  fat and cholesterol for basic functions. However, eating too much of these things can be harmful to your health. Work with your health care provider and dietitian to follow a diet that limits fat and cholesterol. Doing this may help lower your risk for heart disease and other conditions. Choose healthy fats, such as monounsaturated and polyunsaturated fats, and foods high in omega-3 fatty acids. Eat fiber-rich foods, such as whole grains, beans, peas, fruits, and vegetables. Limit or avoid alcohol, fried foods, and foods high in saturated fats, partially hydrogenated oils, and sugar. This information is not intended to replace advice given to you by your health care provider. Make sure you discuss any questions you have with your health care provider. Document Revised: 06/15/2020 Document Reviewed: 06/15/2020 Elsevier Patient Education  2024 [employer].

## 2022-08-28 NOTE — Progress Notes (Deleted)
Madelaine Bhat, CMA,acting as a Neurosurgeon for Arnette Felts, FNP.,have documented all relevant documentation on the behalf of Arnette Felts, FNP,as directed by  Arnette Felts, FNP while in the presence of Arnette Felts, FNP.  Subjective:  Patient ID: Brittany Mcclain , female    DOB: [birth year] , 47 y.o.   MRN: [id number]  No chief complaint on file.   HPI  Patient presents today to establish care, patient would like to discuss    No past medical history on file.   No family history on file.   Current Outpatient Medications:  .  Norgestim-Eth Estrad Triphasic (TRI-PREVIFEM PO), Take 1 tablet by mouth daily., Disp: , Rfl:    No Known Allergies   Review of Systems   There were no vitals filed for this visit. There is no height or weight on file to calculate BMI.  Wt Readings from Last 3 Encounters:  07/07/11 194 lb 3.2 oz (88.1 kg)    The ASCVD Risk score (Arnett DK, et al., 2019) failed to calculate for the following reasons:   The systolic blood pressure is missing   Cannot find a previous HDL lab   Cannot find a previous total cholesterol lab  Objective:  Physical Exam      Assessment And Plan:  Establishing care with new doctor, encounter for  Elevated hemoglobin A1c  Low density lipoprotein (LDL) cholesterol level greater than or equal to 100 mg/dl    No follow-ups on file.  Patient was given opportunity to ask questions. Patient verbalized understanding of the plan and was able to repeat key elements of the plan. All questions were answered to their satisfaction.    Jeanell Sparrow, FNP, have reviewed all documentation for this visit. The documentation on 08/28/22 for the exam, diagnosis, procedures, and orders are all accurate and complete.   IF YOU HAVE BEEN REFERRED TO A SPECIALIST, IT MAY TAKE 1-2 WEEKS TO SCHEDULE/PROCESS THE REFERRAL. IF YOU HAVE NOT HEARD FROM US/SPECIALIST IN TWO WEEKS, PLEASE GIVE [place] A CALL AT [phone].

## 2022-09-10 DIAGNOSIS — E6609 Other obesity due to excess calories: Secondary | ICD-10-CM | POA: Insufficient documentation

## 2022-09-10 DIAGNOSIS — Z7689 Persons encountering health services in other specified circumstances: Secondary | ICD-10-CM | POA: Insufficient documentation

## 2022-09-10 DIAGNOSIS — I10 Essential (primary) hypertension: Secondary | ICD-10-CM | POA: Insufficient documentation

## 2022-09-10 DIAGNOSIS — R7303 Prediabetes: Secondary | ICD-10-CM | POA: Insufficient documentation

## 2022-09-10 DIAGNOSIS — E785 Hyperlipidemia, unspecified: Secondary | ICD-10-CM | POA: Insufficient documentation

## 2022-09-10 NOTE — Assessment & Plan Note (Signed)
Maintain a low salt diet, exercise and eat healthy. Take BP meds as prescribed

## 2022-09-10 NOTE — Assessment & Plan Note (Addendum)
-   Continue atorvastatin 10mg  QD

## 2022-09-10 NOTE — Assessment & Plan Note (Signed)
Check A1c. 

## 2022-09-11 ENCOUNTER — Ambulatory Visit: Payer: BC Managed Care – PPO

## 2022-09-11 VITALS — BP 124/84 | HR 96 | Temp 98.1°F | Ht 66.0 in | Wt 197.0 lb

## 2022-09-11 DIAGNOSIS — I10 Essential (primary) hypertension: Secondary | ICD-10-CM

## 2022-09-11 NOTE — Progress Notes (Signed)
Patient presents today for bpc. She reports taking Amlodipine 5MG . She admits taking medication before coming in this morning. Denies headache, chest pain, SOB.  BP Readings from Last 3 Encounters:  09/11/22 124/84  08/28/22 (!) 140/90  07/07/11 134/87  Per Ellender Hose, patient is to follow a low sodium diet & implement exercise. Follow up appointment scheduled for [place]. Patient aware.

## 2022-09-11 NOTE — Patient Instructions (Signed)
Hypertension, Adult Hypertension is another name for high blood pressure. High blood pressure forces your heart to work harder to pump blood. This can cause problems over time. There are two numbers in a blood pressure reading. There is a top number (systolic) over a bottom number (diastolic). It is best to have a blood pressure that is below 120/80. What are the causes? The cause of this condition is not known. Some other conditions can lead to high blood pressure. What increases the risk? Some lifestyle factors can make you more likely to develop high blood pressure: Smoking. Not getting enough exercise or physical activity. Being overweight. Having too much fat, sugar, calories, or salt (sodium) in your diet. Drinking too much alcohol. Other risk factors include: Having any of these conditions: Heart disease. Diabetes. High cholesterol. Kidney disease. Obstructive sleep apnea. Having a family history of high blood pressure and high cholesterol. Age. The risk increases with age. Stress. What are the signs or symptoms? High blood pressure may not cause symptoms. Very high blood pressure (hypertensive crisis) may cause: Headache. Fast or uneven heartbeats (palpitations). Shortness of breath. Nosebleed. Vomiting or feeling like you may vomit (nauseous). Changes in how you see. Very bad chest pain. Feeling dizzy. Seizures. How is this treated? This condition is treated by making healthy lifestyle changes, such as: Eating healthy foods. Exercising more. Drinking less alcohol. Your doctor may prescribe medicine if lifestyle changes do not help enough and if: Your top number is above 130. Your bottom number is above 80. Your personal target blood pressure may vary. Follow these instructions at home: Eating and drinking  If told, follow the DASH eating plan. To follow this plan: Fill one half of your plate at each meal with fruits and vegetables. Fill one fourth of your plate  at each meal with whole grains. Whole grains include whole-wheat pasta, brown rice, and whole-grain bread. Eat or drink low-fat dairy products, such as skim milk or low-fat yogurt. Fill one fourth of your plate at each meal with low-fat (lean) proteins. Low-fat proteins include fish, chicken without skin, eggs, beans, and tofu. Avoid fatty meat, cured and processed meat, or chicken with skin. Avoid pre-made or processed food. Limit the amount of salt in your diet to less than 1,500 mg each day. Do not drink alcohol if: Your doctor tells you not to drink. You are pregnant, may be pregnant, or are planning to become pregnant. If you drink alcohol: Limit how much you have to: 0-1 drink a day for women. 0-2 drinks a day for men. Know how much alcohol is in your drink. In the [place] one drink equals one 12 oz bottle of beer (355 mL), one 5 oz glass of wine (148 mL), or one 1 oz glass of hard liquor (44 mL). Lifestyle  Work with your doctor to stay at a healthy weight or to lose weight. Ask your doctor what the best weight is for you. Get at least 30 minutes of exercise that causes your heart to beat faster (aerobic exercise) most days of the week. This may include walking, swimming, or biking. Get at least 30 minutes of exercise that strengthens your muscles (resistance exercise) at least 3 days a week. This may include lifting weights or doing Pilates. Do not smoke or use any products that contain nicotine or tobacco. If you need help quitting, ask your doctor. Check your blood pressure at home as told by your doctor. Keep all follow-up visits. Medicines Take over-the-counter and prescription medicines   only as told by your doctor. Follow directions carefully. Do not skip doses of blood pressure medicine. The medicine does not work as well if you skip doses. Skipping doses also puts you at risk for problems. Ask your doctor about side effects or reactions to medicines that you should watch  for. Contact a doctor if: You think you are having a reaction to the medicine you are taking. You have headaches that keep coming back. You feel dizzy. You have swelling in your ankles. You have trouble with your vision. Get help right away if: You get a very bad headache. You start to feel mixed up (confused). You feel weak or numb. You feel faint. You have very bad pain in your: Chest. Belly (abdomen). You vomit more than once. You have trouble breathing. These symptoms may be an emergency. Get help right away. Call 911. Do not wait to see if the symptoms will go away. Do not drive yourself to the hospital. Summary Hypertension is another name for high blood pressure. High blood pressure forces your heart to work harder to pump blood. For most people, a normal blood pressure is less than 120/80. Making healthy choices can help lower blood pressure. If your blood pressure does not get lower with healthy choices, you may need to take medicine. This information is not intended to replace advice given to you by your health care provider. Make sure you discuss any questions you have with your health care provider. Document Revised: 11/22/2020 Document Reviewed: 11/22/2020 Elsevier Patient Education  2024 [employer] Inc[employer]  

## 2022-09-18 DIAGNOSIS — R7309 Other abnormal glucose: Secondary | ICD-10-CM | POA: Diagnosis not present

## 2022-10-10 ENCOUNTER — Other Ambulatory Visit: Payer: Self-pay | Admitting: Obstetrics and Gynecology

## 2022-10-10 DIAGNOSIS — Z1231 Encounter for screening mammogram for malignant neoplasm of breast: Secondary | ICD-10-CM

## 2022-10-19 DIAGNOSIS — R7309 Other abnormal glucose: Secondary | ICD-10-CM | POA: Diagnosis not present

## 2022-10-31 ENCOUNTER — Ambulatory Visit
Admission: RE | Admit: 2022-10-31 | Discharge: 2022-10-31 | Disposition: A | Discharge status: Home / Self Care | Payer: BC Managed Care – PPO | Source: Ambulatory Visit | Attending: Obstetrics and Gynecology | Admitting: Obstetrics and Gynecology

## 2022-10-31 DIAGNOSIS — Z1231 Encounter for screening mammogram for malignant neoplasm of breast: Secondary | ICD-10-CM

## 2022-11-13 ENCOUNTER — Ambulatory Visit: Payer: Self-pay | Admitting: Nurse Practitioner

## 2022-11-15 ENCOUNTER — Other Ambulatory Visit: Payer: Self-pay | Admitting: Family Medicine

## 2022-11-15 DIAGNOSIS — E785 Hyperlipidemia, unspecified: Secondary | ICD-10-CM

## 2022-11-17 ENCOUNTER — Encounter: Payer: Self-pay | Admitting: Family Medicine

## 2022-11-18 ENCOUNTER — Other Ambulatory Visit (HOSPITAL_COMMUNITY): Payer: Self-pay

## 2022-11-18 DIAGNOSIS — R7309 Other abnormal glucose: Secondary | ICD-10-CM | POA: Diagnosis not present

## 2022-11-19 ENCOUNTER — Other Ambulatory Visit (HOSPITAL_COMMUNITY): Payer: Self-pay

## 2022-11-19 MED ORDER — ATORVASTATIN CALCIUM 10 MG PO TABS
10.0000 mg | ORAL_TABLET | Freq: Every day | ORAL | 1 refills | Status: DC
  Filled 2022-11-19: qty 90, 90d supply, fill #0

## 2022-11-28 ENCOUNTER — Encounter: Payer: Self-pay | Admitting: Family Medicine

## 2022-11-28 ENCOUNTER — Ambulatory Visit: Payer: BC Managed Care – PPO | Admitting: Family Medicine

## 2022-11-28 VITALS — BP 120/90 | HR 98 | Temp 98.8°F | Ht 66.0 in | Wt 192.4 lb

## 2022-11-28 DIAGNOSIS — Z23 Encounter for immunization: Secondary | ICD-10-CM | POA: Diagnosis not present

## 2022-11-28 DIAGNOSIS — Z1159 Encounter for screening for other viral diseases: Secondary | ICD-10-CM

## 2022-11-28 DIAGNOSIS — Z0000 Encounter for general adult medical examination without abnormal findings: Secondary | ICD-10-CM | POA: Diagnosis not present

## 2022-11-28 DIAGNOSIS — E6609 Other obesity due to excess calories: Secondary | ICD-10-CM

## 2022-11-28 DIAGNOSIS — R7303 Prediabetes: Secondary | ICD-10-CM | POA: Diagnosis not present

## 2022-11-28 DIAGNOSIS — Z6831 Body mass index (BMI) 31.0-31.9, adult: Secondary | ICD-10-CM

## 2022-11-28 DIAGNOSIS — E66811 Obesity, class 1: Secondary | ICD-10-CM

## 2022-11-28 DIAGNOSIS — I10 Essential (primary) hypertension: Secondary | ICD-10-CM | POA: Diagnosis not present

## 2022-11-28 DIAGNOSIS — E785 Hyperlipidemia, unspecified: Secondary | ICD-10-CM

## 2022-11-28 MED ORDER — ATORVASTATIN CALCIUM 10 MG PO TABS
10.0000 mg | ORAL_TABLET | Freq: Every day | ORAL | 1 refills | Status: DC
Start: 2022-11-28 — End: 2023-03-17

## 2022-11-28 MED ORDER — AMLODIPINE BESYLATE 10 MG PO TABS
10.0000 mg | ORAL_TABLET | Freq: Every day | ORAL | 3 refills | Status: DC
Start: 2022-11-28 — End: 2023-12-17

## 2022-11-28 NOTE — Progress Notes (Addendum)
 I,Victoria T Basil Lim, CMA,acting as a Neurosurgeon for [employer], NP.,have documented all relevant documentation on the behalf of Melodie Spry, NP,as directed by  Melodie Spry, NP while in the presence of Melodie Spry, NP.  Subjective:    Patient ID: Brittany Mcclain , female    DOB: Aug 02, 1975 , 47 y.o.   MRN: [id number]  Chief Complaint  Patient presents with   Annual Exam   Hypertension   Prediabetes   Hyperlipidemia    HPI  Patient is a 47 year old who presents today for annual exam.She has a diagnosis of hypertension and is  established with Dr Arlee Lace for GYN care .  Letter sent to gyn for pap result. She reports completing colonoscopy in 10/31/2019, [facility] and last mammogram was on 10/31/2022, at the [facility].      Past Medical History:  Diagnosis Date   Allergy    Seasonal   Anxiety December 2023   Depression December 2023     Family History  Problem Relation Age of Onset   Hypertension Mother    Hypertension Father      Current Outpatient Medications:    amLODipine  (NORVASC ) 10 MG tablet, Take 1 tablet (10 mg total) by mouth daily., Disp: 90 tablet, Rfl: 3   Calcium  Citrate 150 MG CAPS, Take 1 capsule by mouth daily at 6 (six) AM., Disp: , Rfl:    Cholecalciferol (VITAMIN D) 125 MCG (5000 UT) CAPS, Take 1 capsule by mouth daily at 6 (six) AM., Disp: , Rfl:    Coenzyme Q10-Fish Oil-Vit E (CO-Q 10 OMEGA-3 FISH OIL PO), Take 1 tablet by mouth daily at 6 (six) AM., Disp: , Rfl:    levonorgestrel  (MIRENA ) 20 MCG/DAY IUD, 1 each by Intrauterine route once., Disp: , Rfl:    Zinc 50 MG TABS, Take 1 tablet by mouth daily at 6 (six) AM., Disp: , Rfl:    atorvastatin  (LIPITOR) 10 MG tablet, Take 1 tablet (10 mg total) by mouth daily., Disp: 90 tablet, Rfl: 1   ibuprofen  (ADVIL ) 800 MG tablet, Take 1 tablet (800 mg total) by mouth daily as needed., Disp: 30 tablet, Rfl: 2   traZODone (DESYREL) 50 MG tablet, Take 1 tablet (50 mg total) by mouth at bedtime as needed  for sleep., Disp: 90 tablet, Rfl: 0   Allergies  Allergen Reactions   Sumatriptan Other (See Comments) and Palpitations    tachycardia       Social History   Tobacco Use  Smoking Status Never   Passive exposure: Yes  Smokeless Tobacco Not on file  Tobacco Comments   Might smoke a cigar every 3 to 6 months over the past 2 years  . She has been exposed to passive smoke. The patient's alcohol use is:  Social History   Substance and Sexual Activity  Alcohol Use Yes   Alcohol/week: 9.0 standard drinks of alcohol   Types: 3 Glasses of wine, 6 Standard drinks or equivalent per week   Comment: a couple of times a week     Review of Systems  Constitutional: Negative.   HENT: Negative.    Eyes: Negative.   Respiratory: Negative.    Cardiovascular: Negative.   Gastrointestinal: Negative.   Endocrine: Negative.   Genitourinary: Negative.   Musculoskeletal: Negative.  Negative for joint swelling.  Skin: Negative.   Allergic/Immunologic: Negative.   Neurological: Negative.   Hematological: Negative.   Psychiatric/Behavioral: Negative.       Today's Vitals   11/28/22  0833  BP: (!) 120/90  Pulse: 98  Temp: 98.8 F (37.1 C)  SpO2: 98%  Weight: 192 lb 6.4 oz (87.3 kg)  Height: 5\' 6"  (1.676 m)   Body mass index is 31.05 kg/m.  Wt Readings from Last 3 Encounters:  06/17/23 171 lb (77.6 kg)  11/28/22 192 lb 6.4 oz (87.3 kg)  09/11/22 197 lb (89.4 kg)     Objective:  Physical Exam HENT:     Head: Normocephalic.  Cardiovascular:     Rate and Rhythm: Normal rate.  Pulmonary:     Effort: Pulmonary effort is normal.     Breath sounds: Normal breath sounds.  Abdominal:     General: Bowel sounds are normal.  Musculoskeletal:        General: Normal range of motion.     Cervical back: Normal range of motion.  Skin:    General: Skin is warm and dry.  Neurological:     Mental Status: She is alert and oriented to person, place, and time.  Psychiatric:        Mood and  Affect: Mood normal.        Behavior: Behavior normal.         Assessment And Plan:     Encounter for annual health examination  Primary hypertension -     CBC -     CMP14+EGFR -     amLODIPine  Besylate; Take 1 tablet (10 mg total) by mouth daily.  Dispense: 90 tablet; Refill: 3  Prediabetes -     Hemoglobin A1c  Hyperlipidemia, unspecified hyperlipidemia type -     Lipid panel  Need for Tdap vaccination -     Tdap vaccine greater than or equal to 7yo IM  Encounter for hepatitis C screening test for low risk patient -     Hepatitis C antibody  Class 1 obesity due to excess calories with body mass index (BMI) of 31.0 to 31.9 in adult, unspecified whether serious comorbidity present  She is encouraged to strive for BMI less than 30 to decrease cardiac risk. Advised to aim for at least 150 minutes of exercise per week.    Return in 6 months (on 05/29/2023) for 1 year HM. , blood pressure. Patient was given opportunity to ask questions. Patient verbalized understanding of the plan and was able to repeat key elements of the plan. All questions were answered to their satisfaction.   I, Melodie Spry, NP, have reviewed all documentation for this visit. The documentation on 12/06/22 for the exam, diagnosis, procedures, and orders are all accurate and complete.

## 2022-11-28 NOTE — Patient Instructions (Signed)

## 2022-11-29 LAB — CBC
Hematocrit: 40.2 % (ref 34.0–46.6)
Hemoglobin: 13 g/dL (ref 11.1–15.9)
MCH: 29.3 pg (ref 26.6–33.0)
MCHC: 32.3 g/dL (ref 31.5–35.7)
MCV: 91 fL (ref 79–97)
Platelets: 349 10*3/uL (ref 150–450)
RBC: 4.44 x10E6/uL (ref 3.77–5.28)
RDW: 13.9 % (ref 11.7–15.4)
WBC: 6.9 10*3/uL (ref 3.4–10.8)

## 2022-11-29 LAB — CMP14+EGFR
ALT: 51 [IU]/L — ABNORMAL HIGH (ref 0–32)
AST: 66 [IU]/L — ABNORMAL HIGH (ref 0–40)
Albumin: 4.3 g/dL (ref 3.9–4.9)
Alkaline Phosphatase: 71 [IU]/L (ref 44–121)
BUN/Creatinine Ratio: 6 — ABNORMAL LOW (ref 9–23)
BUN: 5 mg/dL — ABNORMAL LOW (ref 6–24)
Bilirubin Total: 0.7 mg/dL (ref 0.0–1.2)
CO2: 22 mmol/L (ref 20–29)
Calcium: 9.3 mg/dL (ref 8.7–10.2)
Chloride: 101 mmol/L (ref 96–106)
Creatinine, Ser: 0.9 mg/dL (ref 0.57–1.00)
Globulin, Total: 3.3 g/dL (ref 1.5–4.5)
Glucose: 76 mg/dL (ref 70–99)
Potassium: 4.1 mmol/L (ref 3.5–5.2)
Sodium: 139 mmol/L (ref 134–144)
Total Protein: 7.6 g/dL (ref 6.0–8.5)
eGFR: 78 mL/min/{1.73_m2} (ref >59)

## 2022-11-29 LAB — LIPID PANEL
Chol/HDL Ratio: 2.1 {ratio} (ref 0.0–4.4)
Cholesterol, Total: 165 mg/dL (ref 100–199)
HDL: 78 mg/dL (ref >39)
LDL Chol Calc (NIH): 78 mg/dL (ref 0–99)
Triglycerides: 43 mg/dL (ref 0–149)
VLDL Cholesterol Cal: 9 mg/dL (ref 5–40)

## 2022-11-29 LAB — HEMOGLOBIN A1C
Est. average glucose Bld gHb Est-mCnc: 123 mg/dL
Hgb A1c MFr Bld: 5.9 % — ABNORMAL HIGH (ref 4.8–5.6)

## 2022-11-29 LAB — HEPATITIS C ANTIBODY: Hep C Virus Ab: NONREACTIVE

## 2022-12-19 DIAGNOSIS — R7309 Other abnormal glucose: Secondary | ICD-10-CM | POA: Diagnosis not present

## 2022-12-22 ENCOUNTER — Other Ambulatory Visit: Payer: Self-pay | Admitting: Family Medicine

## 2023-01-02 ENCOUNTER — Other Ambulatory Visit (HOSPITAL_COMMUNITY): Payer: Self-pay

## 2023-01-02 MED ORDER — IBUPROFEN 800 MG PO TABS
800.0000 mg | ORAL_TABLET | Freq: Every day | ORAL | 2 refills | Status: DC | PRN
  Filled 2023-01-02: qty 30, 30d supply, fill #0

## 2023-01-05 ENCOUNTER — Other Ambulatory Visit (HOSPITAL_COMMUNITY): Payer: Self-pay

## 2023-01-18 DIAGNOSIS — R7309 Other abnormal glucose: Secondary | ICD-10-CM | POA: Diagnosis not present

## 2023-01-20 DIAGNOSIS — H40023 Open angle with borderline findings, high risk, bilateral: Secondary | ICD-10-CM | POA: Diagnosis not present

## 2023-03-04 ENCOUNTER — Other Ambulatory Visit: Payer: Self-pay | Admitting: Family Medicine

## 2023-03-04 ENCOUNTER — Other Ambulatory Visit (HOSPITAL_COMMUNITY): Payer: Self-pay

## 2023-03-04 DIAGNOSIS — E785 Hyperlipidemia, unspecified: Secondary | ICD-10-CM

## 2023-03-16 ENCOUNTER — Other Ambulatory Visit (HOSPITAL_COMMUNITY): Payer: Self-pay

## 2023-03-17 ENCOUNTER — Other Ambulatory Visit: Payer: Self-pay

## 2023-03-17 DIAGNOSIS — E785 Hyperlipidemia, unspecified: Secondary | ICD-10-CM

## 2023-03-17 MED ORDER — ATORVASTATIN CALCIUM 10 MG PO TABS
10.0000 mg | ORAL_TABLET | Freq: Every day | ORAL | 1 refills | Status: DC
Start: 2023-03-17 — End: 2023-12-17

## 2023-04-28 ENCOUNTER — Other Ambulatory Visit (HOSPITAL_COMMUNITY): Payer: Self-pay

## 2023-05-21 DIAGNOSIS — H40013 Open angle with borderline findings, low risk, bilateral: Secondary | ICD-10-CM | POA: Diagnosis not present

## 2023-06-03 ENCOUNTER — Ambulatory Visit: Payer: BC Managed Care – PPO | Admitting: Family Medicine

## 2023-06-17 ENCOUNTER — Encounter: Payer: Self-pay | Admitting: Family Medicine

## 2023-06-17 ENCOUNTER — Ambulatory Visit: Admitting: Family Medicine

## 2023-06-17 VITALS — BP 112/70 | HR 103 | Temp 99.2°F | Wt 171.0 lb

## 2023-06-17 DIAGNOSIS — F5101 Primary insomnia: Secondary | ICD-10-CM

## 2023-06-17 DIAGNOSIS — I10 Essential (primary) hypertension: Secondary | ICD-10-CM | POA: Diagnosis not present

## 2023-06-17 DIAGNOSIS — R748 Abnormal levels of other serum enzymes: Secondary | ICD-10-CM

## 2023-06-17 DIAGNOSIS — E7800 Pure hypercholesterolemia, unspecified: Secondary | ICD-10-CM | POA: Insufficient documentation

## 2023-06-17 DIAGNOSIS — R7303 Prediabetes: Secondary | ICD-10-CM | POA: Diagnosis not present

## 2023-06-17 MED ORDER — TRAZODONE HCL 50 MG PO TABS
50.0000 mg | ORAL_TABLET | Freq: Every evening | ORAL | 0 refills | Status: DC | PRN
Start: 2023-06-17 — End: 2023-12-17

## 2023-06-17 NOTE — Patient Instructions (Signed)
 Hypertension, Adult Hypertension is another name for high blood pressure. High blood pressure forces your heart to work harder to pump blood. This can cause problems over time. There are two numbers in a blood pressure reading. There is a top number (systolic) over a bottom number (diastolic). It is best to have a blood pressure that is below 120/80. What are the causes? The cause of this condition is not known. Some other conditions can lead to high blood pressure. What increases the risk? Some lifestyle factors can make you more likely to develop high blood pressure: Smoking. Not getting enough exercise or physical activity. Being overweight. Having too much fat, sugar, calories, or salt (sodium) in your diet. Drinking too much alcohol. Other risk factors include: Having any of these conditions: Heart disease. Diabetes. High cholesterol. Kidney disease. Obstructive sleep apnea. Having a family history of high blood pressure and high cholesterol. Age. The risk increases with age. Stress. What are the signs or symptoms? High blood pressure may not cause symptoms. Very high blood pressure (hypertensive crisis) may cause: Headache. Fast or uneven heartbeats (palpitations). Shortness of breath. Nosebleed. Vomiting or feeling like you may vomit (nauseous). Changes in how you see. Very bad chest pain. Feeling dizzy. Seizures. How is this treated? This condition is treated by making healthy lifestyle changes, such as: Eating healthy foods. Exercising more. Drinking less alcohol. Your doctor may prescribe medicine if lifestyle changes do not help enough and if: Your top number is above 130. Your bottom number is above 80. Your personal target blood pressure may vary. Follow these instructions at home: Eating and drinking  If told, follow the DASH eating plan. To follow this plan: Fill one half of your plate at each meal with fruits and vegetables. Fill one fourth of your plate  at each meal with whole grains. Whole grains include whole-wheat pasta, brown rice, and whole-grain bread. Eat or drink low-fat dairy products, such as skim milk or low-fat yogurt. Fill one fourth of your plate at each meal with low-fat (lean) proteins. Low-fat proteins include fish, chicken without skin, eggs, beans, and tofu. Avoid fatty meat, cured and processed meat, or chicken with skin. Avoid pre-made or processed food. Limit the amount of salt in your diet to less than 1,500 mg each day. Do not drink alcohol if: Your doctor tells you not to drink. You are pregnant, may be pregnant, or are planning to become pregnant. If you drink alcohol: Limit how much you have to: 0-1 drink a day for women. 0-2 drinks a day for men. Know how much alcohol is in your drink. In the [place] one drink equals one 12 oz bottle of beer (355 mL), one 5 oz glass of wine (148 mL), or one 1 oz glass of hard liquor (44 mL). Lifestyle  Work with your doctor to stay at a healthy weight or to lose weight. Ask your doctor what the best weight is for you. Get at least 30 minutes of exercise that causes your heart to beat faster (aerobic exercise) most days of the week. This may include walking, swimming, or biking. Get at least 30 minutes of exercise that strengthens your muscles (resistance exercise) at least 3 days a week. This may include lifting weights or doing Pilates. Do not smoke or use any products that contain nicotine or tobacco. If you need help quitting, ask your doctor. Check your blood pressure at home as told by your doctor. Keep all follow-up visits. Medicines Take over-the-counter and prescription medicines  only as told by your doctor. Follow directions carefully. Do not skip doses of blood pressure medicine. The medicine does not work as well if you skip doses. Skipping doses also puts you at risk for problems. Ask your doctor about side effects or reactions to medicines that you should watch  for. Contact a doctor if: You think you are having a reaction to the medicine you are taking. You have headaches that keep coming back. You feel dizzy. You have swelling in your ankles. You have trouble with your vision. Get help right away if: You get a very bad headache. You start to feel mixed up (confused). You feel weak or numb. You feel faint. You have very bad pain in your: Chest. Belly (abdomen). You vomit more than once. You have trouble breathing. These symptoms may be an emergency. Get help right away. Call 911. Do not wait to see if the symptoms will go away. Do not drive yourself to the hospital. Summary Hypertension is another name for high blood pressure. High blood pressure forces your heart to work harder to pump blood. For most people, a normal blood pressure is less than 120/80. Making healthy choices can help lower blood pressure. If your blood pressure does not get lower with healthy choices, you may need to take medicine. This information is not intended to replace advice given to you by your health care provider. Make sure you discuss any questions you have with your health care provider. Document Revised: 11/22/2020 Document Reviewed: 11/22/2020 Elsevier Patient Education  2024 [employer].

## 2023-06-17 NOTE — Progress Notes (Signed)
 I,Jameka J Llittleton, CMA,acting as a Neurosurgeon for [employer], Brittany Mcclain.,have documented all relevant documentation on the behalf of Brittany Spry, Brittany Mcclain,as directed by  Brittany Spry, Brittany Mcclain while in the presence of Brittany Spry, Brittany Mcclain.  Subjective:  Patient ID: Brittany Mcclain , female    DOB: [birth year] , 48 y.o.   MRN: [id number]  Chief Complaint  Patient presents with   Hypertension    HPI  Patient is a 48 year old female who presents today for  hypertension management. Patient reports compliance with her medication. Patient denies having chest pain, sob or headaches at this time and denies any side effects from her medications.  Hypertension This is a chronic problem. The current episode started more than 1 year ago. The problem has been rapidly improving since onset. The problem is controlled. Pertinent negatives include no chest pain or headaches. Agents associated with hypertension include NSAIDs. Risk factors for coronary artery disease include dyslipidemia and family history. Past treatments include calcium  channel blockers. The current treatment provides moderate improvement.     Past Medical History:  Diagnosis Date   Allergy    Seasonal   Anxiety December 2023   Depression December 2023     Family History  Problem Relation Age of Onset   Hypertension Mother    Hypertension Father      Current Outpatient Medications:    amLODipine  (NORVASC ) 10 MG tablet, Take 1 tablet (10 mg total) by mouth daily., Disp: 90 tablet, Rfl: 3   atorvastatin  (LIPITOR) 10 MG tablet, Take 1 tablet (10 mg total) by mouth daily., Disp: 90 tablet, Rfl: 1   Calcium  Citrate 150 MG CAPS, Take 1 capsule by mouth daily at 6 (six) AM., Disp: , Rfl:    Cholecalciferol (VITAMIN D) 125 MCG (5000 UT) CAPS, Take 1 capsule by mouth daily at 6 (six) AM., Disp: , Rfl:    Coenzyme Q10-Fish Oil-Vit E (CO-Q 10 OMEGA-3 FISH OIL PO), Take 1 tablet by mouth daily at 6 (six) AM., Disp: , Rfl:    ibuprofen  (ADVIL ) 800 MG tablet, Take 1  tablet (800 mg total) by mouth daily as needed., Disp: 30 tablet, Rfl: 2   levonorgestrel  (MIRENA ) 20 MCG/DAY IUD, 1 each by Intrauterine route once., Disp: , Rfl:    traZODone  (DESYREL ) 50 MG tablet, Take 1 tablet (50 mg total) by mouth at bedtime as needed for sleep., Disp: 90 tablet, Rfl: 0   Zinc 50 MG TABS, Take 1 tablet by mouth daily at 6 (six) AM., Disp: , Rfl:    Allergies  Allergen Reactions   Sumatriptan Other (See Comments) and Palpitations    tachycardia     Review of Systems  Constitutional: Negative.   HENT: Negative.    Respiratory: Negative.    Cardiovascular: Negative.  Negative for chest pain.  Musculoskeletal: Negative.   Neurological: Negative.  Negative for headaches.  Hematological: Negative.   Psychiatric/Behavioral:  The patient is nervous/anxious.      Today's Vitals   06/17/23 1145  BP: 112/70  Pulse: (!) 103  Temp: 99.2 F (37.3 C)  TempSrc: Oral  Weight: 171 lb (77.6 kg)  PainSc: 0-No pain   Body mass index is 27.6 kg/m.  Wt Readings from Last 3 Encounters:  06/17/23 171 lb (77.6 kg)  11/28/22 192 lb 6.4 oz (87.3 kg)  09/11/22 197 lb (89.4 kg)    The 10-year ASCVD risk score (Arnett DK, et al., 2019) is: 0.9%   Values used to calculate the score:  Age: 58 years     Sex: Female     Is Non-Hispanic African American: Yes     Diabetic: No     Tobacco smoker: No     Systolic Blood Pressure: 112 mmHg     Is BP treated: Yes     HDL Cholesterol: 78 mg/dL     Total Cholesterol: 165 mg/dL  Objective:  Physical Exam HENT:     Head: Normocephalic.  Cardiovascular:     Rate and Rhythm: Regular rhythm. Tachycardia present.  Pulmonary:     Effort: Pulmonary effort is normal.     Breath sounds: Normal breath sounds.  Neurological:     Mental Status: She is alert.         Assessment And Plan:  Primary hypertension Assessment & Plan: BP Readings from Last 3 Encounters:  06/17/23 112/70  11/28/22 (!) 120/90  09/11/22 124/84    Continue Amlodipine  10 mg every day.  Orders: -     CBC -     CMP14+EGFR  Prediabetes Assessment & Plan: Lab Results  Component Value Date   HGBA1C 5.5 06/17/2023   Continue low carb diet  Orders: -     Hemoglobin A1c  Primary insomnia Assessment & Plan: Use trazodone  50 mg as needed for sleep.  Orders: -     traZODone  HCl; Take 1 tablet (50 mg total) by mouth at bedtime as needed for sleep.  Dispense: 90 tablet; Refill: 0  Pure hypercholesterolemia Assessment & Plan: Take Lipitor 10 mg every other day     Return in about 6 months (around 12/17/2023) for physical.  Patient was given opportunity to ask questions. Patient verbalized understanding of the plan and was able to repeat key elements of the plan. All questions were answered to their satisfaction.    I, Brittany Spry, Brittany Mcclain, have reviewed all documentation for this visit. The documentation on 06/22/2023 for the exam, diagnosis, procedures, and orders are all accurate and complete.     IF YOU HAVE BEEN REFERRED TO A SPECIALIST, IT MAY TAKE 1-2 WEEKS TO SCHEDULE/PROCESS THE REFERRAL. IF YOU HAVE NOT HEARD FROM US /SPECIALIST IN TWO WEEKS, PLEASE GIVE US  A CALL AT [phone].

## 2023-06-18 LAB — CMP14+EGFR
ALT: 77 IU/L — ABNORMAL HIGH (ref 0–32)
AST: 95 IU/L — ABNORMAL HIGH (ref 0–40)
Albumin: 4.5 g/dL (ref 3.9–4.9)
Alkaline Phosphatase: 78 IU/L (ref 44–121)
BUN/Creatinine Ratio: 8 — ABNORMAL LOW (ref 9–23)
BUN: 6 mg/dL (ref 6–24)
Bilirubin Total: 1.4 mg/dL — ABNORMAL HIGH (ref 0.0–1.2)
CO2: 20 mmol/L (ref 20–29)
Calcium: 9.3 mg/dL (ref 8.7–10.2)
Chloride: 93 mmol/L — ABNORMAL LOW (ref 96–106)
Creatinine, Ser: 0.76 mg/dL (ref 0.57–1.00)
Globulin, Total: 3.5 g/dL (ref 1.5–4.5)
Glucose: 87 mg/dL (ref 70–99)
Potassium: 3.4 mmol/L — ABNORMAL LOW (ref 3.5–5.2)
Sodium: 134 mmol/L (ref 134–144)
Total Protein: 8 g/dL (ref 6.0–8.5)
eGFR: 96 mL/min/{1.73_m2} (ref >59)

## 2023-06-18 LAB — CBC
Hematocrit: 39.6 % (ref 34.0–46.6)
Hemoglobin: 13.7 g/dL (ref 11.1–15.9)
MCH: 30 pg (ref 26.6–33.0)
MCHC: 34.6 g/dL (ref 31.5–35.7)
MCV: 87 fL (ref 79–97)
Platelets: 223 10*3/uL (ref 150–450)
RBC: 4.57 x10E6/uL (ref 3.77–5.28)
RDW: 14.2 % (ref 11.7–15.4)
WBC: 6.7 10*3/uL (ref 3.4–10.8)

## 2023-06-18 LAB — HEMOGLOBIN A1C
Est. average glucose Bld gHb Est-mCnc: 111 mg/dL
Hgb A1c MFr Bld: 5.5 % (ref 4.8–5.6)

## 2023-06-22 ENCOUNTER — Encounter: Payer: Self-pay | Admitting: Family Medicine

## 2023-06-22 NOTE — Assessment & Plan Note (Signed)
 Use trazodone  50 mg as needed for sleep.

## 2023-06-22 NOTE — Assessment & Plan Note (Signed)
 Take Lipitor 10 mg every other day

## 2023-06-22 NOTE — Assessment & Plan Note (Signed)
 Lab Results  Component Value Date   HGBA1C 5.5 06/17/2023   Continue low carb diet

## 2023-06-22 NOTE — Progress Notes (Addendum)
 A1c is 5.5 which is very good, potassium is 3.4 which was slightly low. Eat foods rich in potassium e.g Bananas, Oranges, prunes etc Liver enzymes are getting higher. US  abdomen ordered to outrule abnormalities.

## 2023-06-22 NOTE — Assessment & Plan Note (Signed)
 BP Readings from Last 3 Encounters:  06/17/23 112/70  11/28/22 (!) 120/90  09/11/22 124/84   Continue Amlodipine  10 mg every day.

## 2023-06-25 ENCOUNTER — Ambulatory Visit
Admission: RE | Admit: 2023-06-25 | Discharge: 2023-06-25 | Disposition: A | Discharge status: Home / Self Care | Source: Ambulatory Visit | Attending: Family Medicine | Admitting: Family Medicine

## 2023-06-25 DIAGNOSIS — R945 Abnormal results of liver function studies: Secondary | ICD-10-CM | POA: Diagnosis not present

## 2023-06-25 DIAGNOSIS — R748 Abnormal levels of other serum enzymes: Secondary | ICD-10-CM

## 2023-06-26 ENCOUNTER — Encounter: Payer: Self-pay | Admitting: Family Medicine

## 2023-06-26 NOTE — Progress Notes (Signed)
 No abnormalities noted in US  abdomen, no gallstones. Possible fatty liver. Low fat diet and exercise advised.  Thanks

## 2023-10-26 ENCOUNTER — Other Ambulatory Visit: Payer: Self-pay | Admitting: Obstetrics and Gynecology

## 2023-10-26 DIAGNOSIS — Z1231 Encounter for screening mammogram for malignant neoplasm of breast: Secondary | ICD-10-CM

## 2023-11-06 ENCOUNTER — Ambulatory Visit
Admission: RE | Admit: 2023-11-06 | Discharge: 2023-11-06 | Disposition: A | Discharge status: Home / Self Care | Source: Ambulatory Visit | Attending: Obstetrics and Gynecology | Admitting: Obstetrics and Gynecology

## 2023-11-06 DIAGNOSIS — Z1231 Encounter for screening mammogram for malignant neoplasm of breast: Secondary | ICD-10-CM

## 2023-11-11 ENCOUNTER — Other Ambulatory Visit: Payer: Self-pay | Admitting: Obstetrics and Gynecology

## 2023-11-11 DIAGNOSIS — Z1231 Encounter for screening mammogram for malignant neoplasm of breast: Secondary | ICD-10-CM

## 2023-12-17 ENCOUNTER — Encounter: Payer: Self-pay | Admitting: Family Medicine

## 2023-12-17 ENCOUNTER — Ambulatory Visit: Admitting: Family Medicine

## 2023-12-17 VITALS — BP 116/80 | HR 84 | Temp 98.3°F | Ht 66.0 in | Wt 167.0 lb

## 2023-12-17 DIAGNOSIS — R7303 Prediabetes: Secondary | ICD-10-CM

## 2023-12-17 DIAGNOSIS — F1090 Alcohol use, unspecified, uncomplicated: Secondary | ICD-10-CM

## 2023-12-17 DIAGNOSIS — I10 Essential (primary) hypertension: Secondary | ICD-10-CM | POA: Diagnosis not present

## 2023-12-17 DIAGNOSIS — Z0000 Encounter for general adult medical examination without abnormal findings: Secondary | ICD-10-CM

## 2023-12-17 DIAGNOSIS — E7800 Pure hypercholesterolemia, unspecified: Secondary | ICD-10-CM | POA: Diagnosis not present

## 2023-12-17 DIAGNOSIS — F330 Major depressive disorder, recurrent, mild: Secondary | ICD-10-CM

## 2023-12-17 DIAGNOSIS — Z5320 Procedure and treatment not carried out because of patient's decision for unspecified reasons: Secondary | ICD-10-CM

## 2023-12-17 DIAGNOSIS — F5101 Primary insomnia: Secondary | ICD-10-CM

## 2023-12-17 DIAGNOSIS — Z1159 Encounter for screening for other viral diseases: Secondary | ICD-10-CM

## 2023-12-17 DIAGNOSIS — Z23 Encounter for immunization: Secondary | ICD-10-CM

## 2023-12-17 MED ORDER — ATORVASTATIN CALCIUM 10 MG PO TABS: 10.0000 mg | ORAL_TABLET | Freq: Every day | ORAL | 1 refills | Status: AC

## 2023-12-17 MED ORDER — TRAZODONE HCL 50 MG PO TABS: 50.0000 mg | ORAL_TABLET | Freq: Every evening | ORAL | 1 refills | Status: AC | PRN

## 2023-12-17 MED ORDER — AMLODIPINE BESYLATE 10 MG PO TABS
10.0000 mg | ORAL_TABLET | Freq: Every day | ORAL | 1 refills | Status: AC
Start: 2023-12-17 — End: 2024-12-16

## 2023-12-17 NOTE — Assessment & Plan Note (Signed)
-  Alcohol intake reduced from daily to three times a week.  -Ongoing counseling for alcohol use

## 2023-12-17 NOTE — Progress Notes (Signed)
 I,Jameka J Llittleton, CMA,acting as a neurosurgeon for [employer], NP.,have documented all relevant documentation on the behalf of Bruna Creighton, NP,as directed by  Bruna Creighton, NP while in the presence of Bruna Creighton, NP.  Subjective:    Patient ID: Brittany Mcclain , female    DOB: December 13, 1975 , 48 y.o.   MRN: [id number]  Chief Complaint  Patient presents with   Annual Exam    Patient is a 48 year old who presents today for annual exam.She is followed Dr Rexene Hoit for GYN care. Patient reports she has been having tingling in her feet for about 2 months now.     HPI Discussed the use of AI scribe software for clinical note transcription with the patient, who gave verbal consent to proceed.  History of Present Illness    Brittany Mcclain is a 48 year old female who presents for an annual physical exam.  She experiences stress and anxiety related to work but has been attending counseling sessions for the past three years, which she finds beneficial. The focus of counseling has shifted as she has learned to manage stress better.  She has a history of alcohol use, initially attributed to stress earlier in the year. Previously, she consumed three beers or a couple of glasses of wine daily after work but has reduced her intake to a beer three times a week.   She is working with a printmaker in alcohol and drug issues. Patient states that she did started drinking because she was depressed but she feels better now and she declines medication use, she is satisfied with counseling only.  She has an IUD placed approximately five years ago when she was sexually active, but she is not currently sexually active. She has not visited her GYN since the IUD placement and is due for a follow-up.  No constipation is reported, and she has regular bowel movements, though not daily, and no pain. She is currently taking trazodone .      Past Medical History:  Diagnosis Date   Allergy    Seasonal    Anxiety December 2023   Depression December 2023     Family History  Problem Relation Age of Onset   Hypertension Mother    Hypertension Father      Current Outpatient Medications:    Calcium  Citrate 150 MG CAPS, Take 1 capsule by mouth daily at 6 (six) AM., Disp: , Rfl:    Cholecalciferol (VITAMIN D) 125 MCG (5000 UT) CAPS, Take 1 capsule by mouth daily at 6 (six) AM., Disp: , Rfl:    Coenzyme Q10-Fish Oil-Vit E (CO-Q 10 OMEGA-3 FISH OIL PO), Take 1 tablet by mouth daily at 6 (six) AM., Disp: , Rfl:    ibuprofen  (ADVIL ) 800 MG tablet, Take 1 tablet (800 mg total) by mouth daily as needed., Disp: 30 tablet, Rfl: 2   levonorgestrel  (MIRENA ) 20 MCG/DAY IUD, 1 each by Intrauterine route once., Disp: , Rfl:    Zinc 50 MG TABS, Take 1 tablet by mouth daily at 6 (six) AM., Disp: , Rfl:    amLODipine  (NORVASC ) 10 MG tablet, Take 1 tablet (10 mg total) by mouth daily., Disp: 90 tablet, Rfl: 1   atorvastatin  (LIPITOR) 10 MG tablet, Take 1 tablet (10 mg total) by mouth daily., Disp: 90 tablet, Rfl: 1   traZODone  (DESYREL ) 50 MG tablet, Take 1 tablet (50 mg total) by mouth at bedtime as needed for sleep., Disp: 90 tablet, Rfl: 1  Allergies  Allergen Reactions   Sumatriptan Other (See Comments) and Palpitations    tachycardia       Social History   Tobacco Use  Smoking Status Never   Passive exposure: Yes  Smokeless Tobacco Not on file  Tobacco Comments   Might smoke a cigar every 3 to 6 months over the past 2 years   Social History   Substance and Sexual Activity  Alcohol Use Yes   Alcohol/week: 9.0 standard drinks of alcohol   Types: 3 Glasses of wine, 6 Standard drinks or equivalent per week   Comment: a couple of times a week    Review of Systems  Constitutional: Negative.   HENT: Negative.    Eyes: Negative.   Respiratory: Negative.    Cardiovascular: Negative.   Gastrointestinal: Negative.   Endocrine: Negative.   Genitourinary: Negative.   Musculoskeletal:  Negative.   Skin: Negative.   Allergic/Immunologic: Negative.   Neurological: Negative.   Hematological: Negative.   Psychiatric/Behavioral:  Positive for dysphoric mood. The patient is nervous/anxious.      Today's Vitals   12/17/23 0823  BP: 116/80  Pulse: 84  Temp: 98.3 F (36.8 C)  TempSrc: Oral  Weight: 167 lb (75.8 kg)  Height: 5' 6 (1.676 m)  PainSc: 0-No pain   Body mass index is 26.95 kg/m.  Wt Readings from Last 3 Encounters:  12/17/23 167 lb (75.8 kg)  06/17/23 171 lb (77.6 kg)  11/28/22 192 lb 6.4 oz (87.3 kg)     Objective:  Physical Exam Constitutional:      Appearance: Normal appearance.  HENT:     Head: Normocephalic.     Nose: Nose normal.  Cardiovascular:     Rate and Rhythm: Normal rate and regular rhythm.     Pulses: Normal pulses.     Heart sounds: Normal heart sounds.  Pulmonary:     Effort: Pulmonary effort is normal.     Breath sounds: Normal breath sounds.  Abdominal:     General: Bowel sounds are normal.  Musculoskeletal:        General: Normal range of motion.  Skin:    General: Skin is warm and dry.  Neurological:     General: No focal deficit present.     Mental Status: She is alert and oriented to person, place, and time. Mental status is at baseline.  Psychiatric:        Mood and Affect: Mood normal.         Assessment And Plan:     Encounter for annual health examination  Primary hypertension Assessment & Plan: Blood pressure management ongoing with medication. Regular follow-up every six months recommended. - Renew blood pressure medication.  Orders: -     EKG 12-Lead -     CBC -     CMP14+EGFR -     amLODIPine  Besylate; Take 1 tablet (10 mg total) by mouth daily.  Dispense: 90 tablet; Refill: 1 -     POCT URINALYSIS DIP (CLINITEK) -     Microalbumin / creatinine urine ratio  Prediabetes Assessment & Plan: Previously slightly elevated A1c levels. Monitoring through blood work planned.  Orders: -      Hemoglobin A1c  Pure hypercholesterolemia Assessment & Plan: -Cholesterol levels well-managed on current medication -continue current medication.  Orders: -     Lipid panel -     Atorvastatin  Calcium ; Take 1 tablet (10 mg total) by mouth daily.  Dispense: 90 tablet; Refill: 1  Primary insomnia -  traZODone  HCl; Take 1 tablet (50 mg total) by mouth at bedtime as needed for sleep.  Dispense: 90 tablet; Refill: 1  Need for influenza vaccination -     Flu vaccine trivalent PF, 6mos and older(Flulaval,Afluria,Fluarix,Fluzone)  HIV screening declined  Need for hepatitis B screening test -     Hepatitis B surface antibody,qualitative  Mild episode of recurrent major depressive disorder Assessment & Plan: -Continue therapy -Counseling ongoing for three years with perceived benefit.  -Medication option discussed if counseling becomes insufficient.   Alcohol use Assessment & Plan: -Alcohol intake reduced from daily to three times a week.  -Ongoing counseling for alcohol use     Assessment & Plan Adult Wellness Visit Annual physical examination completed. Colonoscopy in 2021 revealed two polyps. - Perform blood work to check cholesterol and A1c levels. - Follow up with GYN for Pap smear.  Essential hypertension Blood pressure management ongoing with medication. Regular follow-up every six months recommended. - Renew blood pressure medication. - Schedule follow-up every six months.  Hyperlipidemia . Potential reduction of statin frequency discussed if levels remain controlled. - Perform blood work to check cholesterol levels. - Consider reducing statin frequency to every other day if cholesterol levels are well-managed.  Prediabetes Previously slightly elevated A1c levels. Monitoring through blood work planned. - Perform blood work to check A1c levels.  Alcohol use, currently reducing . - Continue counseling for alcohol use.  Anxiety and depression  Depression  screening planned. - Conduct depression screening. - Continue counseling for anxiety and depression.    Return in about 6 months (around 06/16/2024) for 1 year physical,bpc. Patient was given opportunity to ask questions. Patient verbalized understanding of the plan and was able to repeat key elements of the plan. All questions were answered to their satisfaction.   I, Bruna Creighton, NP, have reviewed all documentation for this visit. The documentation on 12/17/2023 for the exam, diagnosis, procedures, and orders are all accurate and complete.

## 2023-12-17 NOTE — Assessment & Plan Note (Signed)
-  Cholesterol levels well-managed on current medication -continue current medication.

## 2023-12-17 NOTE — Patient Instructions (Signed)

## 2023-12-17 NOTE — Assessment & Plan Note (Addendum)
-  Continue therapy -Counseling ongoing for three years with perceived benefit.  -Medication option discussed if counseling becomes insufficient.

## 2023-12-18 LAB — CMP14+EGFR
ALT: 92 IU/L — ABNORMAL HIGH (ref 0–32)
AST: 95 IU/L — ABNORMAL HIGH (ref 0–40)
Albumin: 4.1 g/dL (ref 3.9–4.9)
Alkaline Phosphatase: 80 IU/L (ref 41–116)
BUN/Creatinine Ratio: 8 — ABNORMAL LOW (ref 9–23)
BUN: 7 mg/dL (ref 6–24)
Bilirubin Total: 1 mg/dL (ref 0.0–1.2)
CO2: 25 mmol/L (ref 20–29)
Calcium: 9.3 mg/dL (ref 8.7–10.2)
Chloride: 98 mmol/L (ref 96–106)
Creatinine, Ser: 0.88 mg/dL (ref 0.57–1.00)
Globulin, Total: 3.2 g/dL (ref 1.5–4.5)
Glucose: 83 mg/dL (ref 70–99)
Potassium: 3.4 mmol/L — ABNORMAL LOW (ref 3.5–5.2)
Sodium: 136 mmol/L (ref 134–144)
Total Protein: 7.3 g/dL (ref 6.0–8.5)
eGFR: 80 mL/min/1.73 (ref >59)

## 2023-12-18 LAB — CBC
Hematocrit: 38.7 % (ref 34.0–46.6)
Hemoglobin: 12.7 g/dL (ref 11.1–15.9)
MCH: 31.2 pg (ref 26.6–33.0)
MCHC: 32.8 g/dL (ref 31.5–35.7)
MCV: 95 fL (ref 79–97)
Platelets: 256 x10E3/uL (ref 150–450)
RBC: 4.07 x10E6/uL (ref 3.77–5.28)
RDW: 13.7 % (ref 11.7–15.4)
WBC: 4.9 x10E3/uL (ref 3.4–10.8)

## 2023-12-18 LAB — HEMOGLOBIN A1C
Est. average glucose Bld gHb Est-mCnc: 105 mg/dL
Hgb A1c MFr Bld: 5.3 % (ref 4.8–5.6)

## 2023-12-18 LAB — POCT URINALYSIS DIP (CLINITEK)
Bilirubin, UA: NEGATIVE
Blood, UA: NEGATIVE
Glucose, UA: NEGATIVE mg/dL — AB
Ketones, POC UA: NEGATIVE mg/dL
Leukocytes, UA: NEGATIVE
Nitrite, UA: NEGATIVE
POC PROTEIN,UA: NEGATIVE
Spec Grav, UA: 1.015 (ref 1.010–1.025)
Urobilinogen, UA: 0.2 U/dL
pH, UA: 7 (ref 5.0–8.0)

## 2023-12-18 LAB — LIPID PANEL
Chol/HDL Ratio: 3 ratio (ref 0.0–4.4)
Cholesterol, Total: 187 mg/dL (ref 100–199)
HDL: 63 mg/dL (ref >39)
LDL Chol Calc (NIH): 115 mg/dL — ABNORMAL HIGH (ref 0–99)
Triglycerides: 45 mg/dL (ref 0–149)
VLDL Cholesterol Cal: 9 mg/dL (ref 5–40)

## 2023-12-18 LAB — HEPATITIS B SURFACE ANTIBODY,QUALITATIVE: Hep B Surface Ab, Qual: NONREACTIVE

## 2023-12-19 LAB — MICROALBUMIN / CREATININE URINE RATIO
Creatinine, Urine: 49.5 mg/dL
Microalb/Creat Ratio: <6 mg/g{creat} (ref 0–29)
Microalbumin, Urine: <3 ug/mL

## 2023-12-29 ENCOUNTER — Ambulatory Visit: Payer: Self-pay | Admitting: Family Medicine

## 2023-12-29 DIAGNOSIS — R748 Abnormal levels of other serum enzymes: Secondary | ICD-10-CM | POA: Insufficient documentation

## 2023-12-29 NOTE — Progress Notes (Signed)
 A!c is 5.3 , this has improved, no diabetes.Potassium is slightly low, eat foods high in potassium e.g banana, oranges,prunes etc Your LDL is elevated , low fat diet and exercise advised.  Your liver enzymes are still elevated, your abdominal ultrasound was negative. Are you following a low fat diet? Have you been exercising?  I wiill refer you to a [facility] doctor for further evaluation.  Thank you!

## 2023-12-29 NOTE — Assessment & Plan Note (Signed)
 Previously slightly elevated A1c levels. Monitoring through blood work planned.

## 2023-12-29 NOTE — Assessment & Plan Note (Signed)
 Blood pressure management ongoing with medication. Regular follow-up every six months recommended. - Renew blood pressure medication.

## 2024-02-03 ENCOUNTER — Other Ambulatory Visit: Payer: Self-pay | Admitting: Family Medicine

## 2024-02-06 ENCOUNTER — Other Ambulatory Visit: Payer: Self-pay | Admitting: Family Medicine

## 2024-02-06 ENCOUNTER — Other Ambulatory Visit (HOSPITAL_COMMUNITY): Payer: Self-pay

## 2024-02-08 ENCOUNTER — Other Ambulatory Visit (HOSPITAL_COMMUNITY): Payer: Self-pay

## 2024-02-08 ENCOUNTER — Other Ambulatory Visit: Payer: Self-pay

## 2024-02-08 MED ORDER — IBUPROFEN 800 MG PO TABS
800.0000 mg | ORAL_TABLET | Freq: Every day | ORAL | 2 refills | Status: AC | PRN
  Filled 2024-02-08: qty 30, 30d supply, fill #0

## 2024-06-16 ENCOUNTER — Ambulatory Visit: Payer: Self-pay | Admitting: Family Medicine

## 2024-12-22 ENCOUNTER — Encounter: Payer: Self-pay | Admitting: Family Medicine
